# Patient Record
Sex: Male | Born: 1970
Health system: Southern US, Community
[De-identification: ages and names within clinical notes are randomized; demographics above are authoritative.]

## PROBLEM LIST (undated history)

## (undated) DIAGNOSIS — F419 Anxiety disorder, unspecified: Secondary | ICD-10-CM

## (undated) DIAGNOSIS — K589 Irritable bowel syndrome without diarrhea: Secondary | ICD-10-CM

## (undated) DIAGNOSIS — K219 Gastro-esophageal reflux disease without esophagitis: Secondary | ICD-10-CM

## (undated) DIAGNOSIS — E785 Hyperlipidemia, unspecified: Secondary | ICD-10-CM

## (undated) HISTORY — DX: Anxiety disorder, unspecified: F41.9

## (undated) HISTORY — DX: Irritable bowel syndrome, unspecified: K58.9

## (undated) HISTORY — DX: Gastro-esophageal reflux disease without esophagitis: K21.9

## (undated) HISTORY — DX: Hyperlipidemia, unspecified: E78.5

## (undated) HISTORY — PX: FORAMINOTOMY 1 LEVEL: SHX5835

---

## 2002-03-20 HISTORY — PX: WRIST SURGERY: SHX841

## 2011-12-15 DIAGNOSIS — K589 Irritable bowel syndrome without diarrhea: Secondary | ICD-10-CM | POA: Insufficient documentation

## 2011-12-15 DIAGNOSIS — E785 Hyperlipidemia, unspecified: Secondary | ICD-10-CM | POA: Insufficient documentation

## 2011-12-15 DIAGNOSIS — M542 Cervicalgia: Secondary | ICD-10-CM | POA: Insufficient documentation

## 2012-04-26 DIAGNOSIS — Z0000 Encounter for general adult medical examination without abnormal findings: Secondary | ICD-10-CM | POA: Insufficient documentation

## 2013-05-02 ENCOUNTER — Encounter: Payer: Self-pay | Admitting: Gastroenterology

## 2013-05-02 DIAGNOSIS — R1013 Epigastric pain: Secondary | ICD-10-CM | POA: Insufficient documentation

## 2013-05-02 DIAGNOSIS — K219 Gastro-esophageal reflux disease without esophagitis: Secondary | ICD-10-CM | POA: Insufficient documentation

## 2013-05-09 ENCOUNTER — Ambulatory Visit (INDEPENDENT_AMBULATORY_CARE_PROVIDER_SITE_OTHER): Payer: BC Managed Care – PPO | Admitting: Gastroenterology

## 2013-05-09 VITALS — BP 122/72 | HR 80 | Ht 69.0 in | Wt 177.2 lb

## 2013-05-09 DIAGNOSIS — R1013 Epigastric pain: Secondary | ICD-10-CM

## 2013-05-09 DIAGNOSIS — R198 Other specified symptoms and signs involving the digestive system and abdomen: Secondary | ICD-10-CM

## 2013-05-09 DIAGNOSIS — R195 Other fecal abnormalities: Secondary | ICD-10-CM

## 2013-05-09 MED ORDER — DEXLANSOPRAZOLE 60 MG PO CPDR: 60.0000 mg | DELAYED_RELEASE_CAPSULE | Freq: Every day | ORAL | Status: DC

## 2013-05-09 MED ORDER — SOD PICOSULFATE-MAG OX-CIT ACD 10-3.5-12 MG-GM-GM PO PACK: 1.0000 | PACK | ORAL | Status: DC

## 2013-05-09 MED ORDER — ESOMEPRAZOLE MAGNESIUM 40 MG PO CPDR: 40.0000 mg | DELAYED_RELEASE_CAPSULE | Freq: Every day | ORAL | Status: DC

## 2013-05-09 NOTE — Patient Instructions (Signed)
You have been scheduled for an endoscopy and colonoscopy with propofol. Please follow the written instructions given to you at your visit today. Please pick up your prep at the pharmacy within the next 1-3 days. If you use inhalers (even only as needed), please bring them with you on the day of your procedure.  We have sent the following medications to your pharmacy for you to pick up at your convenience: Nexium. Samples given to start one tablet by mouth once daily.   Thank you for choosing me and [facility].  Venita LickMalcolm T. Pleas KochStark, Jr., MD., [facility]  ZO:[id number]:William Clelia CroftShaw, MD

## 2013-05-09 NOTE — Progress Notes (Signed)
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 He describes it as a hunger pain. Takes ibuprofen frequently and recently discontinued this. He began lansoprazole 15 mg daily and ranitidine 150 mg daily about 10 days ago and his symptoms have started to improve. He relates a history of occasional reflux symptoms over the past few years treated with Zantac, Prilosec or Prevacid as needed. He also relates a change in bowel habits noted about 1 year ago noting occasionally  looser stools associated with occasional bloating and carries a presumptive diagnosis of IBS. Recent Hemosure testing was positive. His blood work and urinalysis were unremarkable except for mildly elevated cholesterol of 213. Denies weight loss, constipation,  change in stool caliber, melena, hematochezia, nausea, vomiting, dysphagia, chest pain.  No Known Allergies No outpatient prescriptions prior to visit.   No facility-administered medications prior to visit.   Past Medical History  Diagnosis Date  . Hyperlipidemia   . IBS (irritable bowel syndrome)   . GERD (gastroesophageal reflux disease)    Past Surgical History  Procedure Laterality Date  . Foraminotomy 1 level      C7  . Wrist surgery Left 2004   History   Social History  . Marital Status: Married    Spouse Name: N/A    Number of Children: N/A  . Years of Education: N/A   Social History Main Topics  . Smoking status: Former Games developermoker  . Smokeless tobacco: Never Used  . Alcohol Use: Yes  . Drug Use: No  . Sexual Activity: None   Other Topics Concern  . None   Social History Narrative  . None   Family History  Problem Relation Age of Onset  . Uterine cancer      Grandmother  . Arthritis Father   . Hyperlipidemia Father   . Alzheimer's disease Paternal Grandmother   . Alcoholism Paternal Grandfather   . Cirrhosis Paternal Grandfather     Review of  Systems: Pertinent positive and negative review of systems were noted in the above HPI section. All other review of systems were otherwise negative.   Physical Exam: General: Well developed , well nourished, no acute distress Head: Normocephalic and atraumatic Eyes:  sclerae anicteric, EOMI Ears: Normal auditory acuity Mouth: No deformity or lesions Neck: Supple, no masses or thyromegaly Lungs: Clear throughout to auscultation Heart: Regular rate and rhythm; no murmurs, rubs or bruits Abdomen: Soft, minimal epigastric tenderness without rebound or guarding and non distended. No masses, hepatosplenomegaly or hernias noted. Normal Bowel sounds Rectal: Deferred to colonoscopy Musculoskeletal: Symmetrical with no gross deformities  Skin: No lesions on visible extremities Pulses:  Normal pulses noted Extremities: No clubbing, cyanosis, edema or deformities noted Neurological: Alert oriented x 4, grossly nonfocal Cervical Nodes:  No significant cervical adenopathy Inguinal Nodes: No significant inguinal adenopathy Psychological:  Alert and cooperative. Normal mood and affect  Assessment and Recommendations:  1. Epigastric pain, change in bowel habits and Hemosure positive stool. iFOT testing is designed to be specific for colorectal blood loss. I strongly suspect he has gastritis, duodenitis or an ulcer. Rule out colorectal neoplasms and other colorectal sources of blood loss. Continue to avoid aspirin and NSAID products. Begin Nexium 40 mg daily in place of lansoprazole and ranitidine. Schedule upper endoscopy and colonoscopy. The risks, benefits, and alternatives to endoscopy with possible biopsy and possible dilation were discussed with the patient and they consent to proceed. The risks,  benefits, and alternatives to colonoscopy with possible biopsy and possible polypectomy were discussed with the patient and they consent to proceed.

## 2013-05-14 ENCOUNTER — Encounter: Payer: Self-pay | Admitting: Gastroenterology

## 2013-06-06 ENCOUNTER — Ambulatory Visit (AMBULATORY_SURGERY_CENTER): Payer: BC Managed Care – PPO | Admitting: Gastroenterology

## 2013-06-06 ENCOUNTER — Ambulatory Visit: Payer: Self-pay | Admitting: Gastroenterology

## 2013-06-06 ENCOUNTER — Encounter: Payer: Self-pay | Admitting: Gastroenterology

## 2013-06-06 VITALS — BP 125/77 | HR 74 | Temp 97.4°F | Resp 16 | Ht 69.0 in | Wt 177.0 lb

## 2013-06-06 DIAGNOSIS — K2970 Gastritis, unspecified, without bleeding: Secondary | ICD-10-CM

## 2013-06-06 DIAGNOSIS — K2940 Chronic atrophic gastritis without bleeding: Secondary | ICD-10-CM

## 2013-06-06 DIAGNOSIS — R195 Other fecal abnormalities: Secondary | ICD-10-CM

## 2013-06-06 DIAGNOSIS — R1013 Epigastric pain: Secondary | ICD-10-CM

## 2013-06-06 DIAGNOSIS — K2990 Gastroduodenitis, unspecified, without bleeding: Secondary | ICD-10-CM

## 2013-06-06 MED ORDER — SODIUM CHLORIDE 0.9 % IV SOLN: 500.0000 mL | INTRAVENOUS | Status: DC

## 2013-06-06 NOTE — Patient Instructions (Addendum)
YOU HAD AN ENDOSCOPIC PROCEDURE TODAY AT THE [facility]: Refer to the procedure report that was given to you for any specific questions about what was found during the examination.  If the procedure report does not answer your questions, please call your gastroenterologist to clarify.  If you requested that your care partner not be given the details of your procedure findings, then the procedure report has been included in a sealed envelope for you to review at your convenience later.  YOU SHOULD EXPECT: Some feelings of bloating in the abdomen. Passage of more gas than usual.  Walking can help get rid of the air that was put into your GI tract during the procedure and reduce the bloating. If you had a lower endoscopy (such as a colonoscopy or flexible sigmoidoscopy) you may notice spotting of blood in your stool or on the toilet paper. If you underwent a bowel prep for your procedure, then you may not have a normal bowel movement for a few days.  DIET: Your first meal following the procedure should be a light meal and then it is ok to progress to your normal diet.  A half-sandwich or bowl of soup is an example of a good first meal.  Heavy or fried foods are harder to digest and may make you feel nauseous or bloated.  Likewise meals heavy in dairy and vegetables can cause extra gas to form and this can also increase the bloating.  Drink plenty of fluids but you should avoid alcoholic beverages for 24 hours.  ACTIVITY: Your care partner should take you home directly after the procedure.  You should plan to take it easy, moving slowly for the rest of the day.  You can resume normal activity the day after the procedure however you should NOT DRIVE or use heavy machinery for 24 hours (because of the sedation medicines used during the test).    SYMPTOMS TO REPORT IMMEDIATELY: A gastroenterologist can be reached at any hour.  During normal business hours, 8:30 AM to 5:00 PM Monday through Friday,  call [phone].  After hours and on weekends, please call the GI answering service at [phone] who will take a message and have the physician on call contact you.   Following lower endoscopy (colonoscopy or flexible sigmoidoscopy):  Excessive amounts of blood in the stool  Significant tenderness or worsening of abdominal pains  Swelling of the abdomen that is new, acute  Fever of 100F or higher   Following upper endoscopy (EGD)  Vomiting of blood or coffee ground material  New chest pain or pain under the shoulder blades  Painful or persistently difficult swallowing  New shortness of breath  Fever of 100F or higher  Black, tarry-looking stools  FOLLOW UP: If any biopsies were taken you will be contacted by phone or by letter within the next 1-3 weeks.  Call your gastroenterologist if you have not heard about the biopsies in 3 weeks.  Our staff will call the home number listed on your records the next business day following your procedure to check on you and address any questions or concerns that you may have at that time regarding the information given to you following your procedure. This is a courtesy call and so if there is no answer at the home number and we have not heard from you through the emergency physician on call, we will assume that you have returned to your regular daily activities without incident.  SIGNATURES/CONFIDENTIALITY: You and/or your  care partner have signed paperwork which will be entered into your electronic medical record.  These signatures attest to the fact that that the information above on your After Visit Summary has been reviewed and is understood.  Full responsibility of the confidentiality of this discharge information lies with you and/or your care-partner.  Hemorrhoids, gastritis, anti-reflux regimen-handouts given  Repeat colonoscopy in 10 years.  Avoid NSAIDs.  Continue nexium.

## 2013-06-06 NOTE — Op Note (Signed)
[facility] [address] [place], [id number]   COLONOSCOPY PROCEDURE REPORT  PATIENT: Jose Maddox, Abbott  MR#: [id number] BIRTHDATE: 1970/05/22 , 42  yrs. old GENDER: Male ENDOSCOPIST: Meryl DareMalcolm T Abbe Bula, MD, [facility] REFERRED [id number]:Douglass Cyndie MullW Shaw, M.D. PROCEDURE DATE:  06/06/2013 PROCEDURE:   Colonoscopy, diagnostic First Screening Colonoscopy - Avg.  risk and is 50 yrs.  old or older - No.  Prior Negative Screening - Now for repeat screening. N/A  History of Adenoma - Now for follow-up colonoscopy & has been > or = to 3 yrs.  N/A  Polyps Removed Today? No.  Recommend repeat exam, <10 yrs? No. ASA CLASS:   Class II INDICATIONS:heme-positive stool and change in bowel habits. MEDICATIONS: MAC sedation, administered by CRNA and propofol (Diprivan) 360mg  IV DESCRIPTION OF PROCEDURE:   After the risks benefits and alternatives of the procedure were thoroughly explained, informed consent was obtained.  A digital rectal exam revealed no abnormalities of the rectum.   The LB [id number]  endoscope was introduced through the anus and advanced to the cecum, which was identified by both the appendix and ileocecal valve. No adverse events experienced.   The quality of the prep was Prepopik excellent  The instrument was then slowly withdrawn as the colon was fully examined.  COLON FINDINGS: A normal appearing cecum, ileocecal valve, and appendiceal orifice were identified.  The ascending, hepatic flexure, transverse, splenic flexure, descending, sigmoid colon and rectum appeared unremarkable.  No polyps or cancers were seen. Retroflexed views revealed small internal hemorrhoids. The time to cecum=1 minutes 36 seconds.  Withdrawal time=11 minutes 45 seconds. The scope was withdrawn and the procedure completed.  COMPLICATIONS: There were no complications.  ENDOSCOPIC IMPRESSION: 1.  Normal colon 2.  Small internal hemorrhoids  RECOMMENDATIONS: 1.  You should continue to  follow colorectal cancer screening guidelines for "routine risk" patients with a repeat colonoscopy in 10 years.  There is no need for routine, screening FOBT (stool) testing for at least 5 years.  eSigned:  Meryl DareMalcolm T Lakya Schrupp, MD, [facility] 06/06/2013 4:03 PM

## 2013-06-06 NOTE — Progress Notes (Signed)
Procedure ends, to recovery, report given and VSS. 

## 2013-06-06 NOTE — Progress Notes (Signed)
Called to room to assist during endoscopic procedure.  Patient ID and intended procedure confirmed with present staff. Received instructions for my participation in the procedure from the performing physician.  

## 2013-06-06 NOTE — Op Note (Signed)
[facility] [address] [place], [id number]   ENDOSCOPY PROCEDURE REPORT  PATIENT: Jose Maddox, Jose Maddox  MR#: [id number] BIRTHDATE: [birth year] , 42  yrs. old GENDER: Male ENDOSCOPIST: Meryl DareMalcolm T Suhaylah Wampole, MD, [facility] REFERRED BY:  Kari BaarsW.  Douglas Shaw, M.D. PROCEDURE DATE:  06/06/2013 PROCEDURE:  EGD w/ biopsy ASA CLASS:     Class II INDICATIONS:  Epigastric pain.   Heme positive stool. MEDICATIONS: There was residual sedation effect present from prior procedure, MAC sedation, administered by CRNA, and propofol (Diprivan) 280mg  IV TOPICAL ANESTHETIC: none DESCRIPTION OF PROCEDURE: After the risks benefits and alternatives of the procedure were thoroughly explained, informed consent was obtained.  The LB [id number] endoscope was introduced through the mouth and advanced to the second portion of the duodenum. Without limitations.  The instrument was slowly withdrawn as the mucosa was fully examined.  STOMACH: Mild gastritis  was found in the gastric body.  Multiple biopsies were performed.   The stomach otherwise appeared normal.  ESOPHAGUS: The mucosa of the esophagus appeared normal. DUODENUM: The duodenal mucosa showed no abnormalities in the bulb and second portion of the duodenum.  Retroflexed views revealed no abnormalities.   The scope was then withdrawn from the patient and the procedure completed.  COMPLICATIONS: There were no complications.  ENDOSCOPIC IMPRESSION: 1.   Gastritis in the gastric body; multiple biopsies 2.   The EGD otherwise appeared normal  RECOMMENDATIONS: 1.  Anti-reflux regimen 2.  Avoid NSAIDS 3.  Continue PPI 4.  Await pathology results  eSigned:  Meryl DareMalcolm T Jari Carollo, MD, [facility] 06/06/2013 4:11 PM

## 2013-06-09 ENCOUNTER — Telehealth: Payer: Self-pay | Admitting: *Deleted

## 2013-06-09 NOTE — Telephone Encounter (Signed)
  Follow up Call-  Call back number 06/06/2013  Post procedure Call Back phone  # [phone]  Permission to leave phone message Yes     Patient questions:  Do you have a fever, pain , or abdominal swelling? no Pain Score  0 *  Have you tolerated food without any problems? yes  Have you been able to return to your normal activities? yes  Do you have any questions about your discharge instructions: Diet   no Medications  no Follow up visit  no  Do you have questions or concerns about your Care? no  Actions: * If pain score is 4 or above: No action needed, pain <4.

## 2013-06-11 ENCOUNTER — Encounter: Payer: Self-pay | Admitting: Gastroenterology

## 2015-03-03 ENCOUNTER — Other Ambulatory Visit: Payer: Self-pay | Admitting: Internal Medicine

## 2015-03-03 DIAGNOSIS — R1310 Dysphagia, unspecified: Secondary | ICD-10-CM

## 2015-03-16 ENCOUNTER — Ambulatory Visit
Admission: RE | Admit: 2015-03-16 | Discharge: 2015-03-16 | Disposition: A | Discharge status: Home / Self Care | Payer: BLUE CROSS/BLUE SHIELD | Source: Ambulatory Visit | Attending: Internal Medicine | Admitting: Internal Medicine

## 2015-03-16 DIAGNOSIS — R1310 Dysphagia, unspecified: Secondary | ICD-10-CM

## 2015-05-05 ENCOUNTER — Encounter: Payer: Self-pay | Admitting: Internal Medicine

## 2015-11-16 DIAGNOSIS — N529 Male erectile dysfunction, unspecified: Secondary | ICD-10-CM | POA: Diagnosis not present

## 2015-12-17 DIAGNOSIS — Z125 Encounter for screening for malignant neoplasm of prostate: Secondary | ICD-10-CM | POA: Diagnosis not present

## 2015-12-17 DIAGNOSIS — Z0000 Encounter for general adult medical examination without abnormal findings: Secondary | ICD-10-CM | POA: Diagnosis not present

## 2015-12-17 DIAGNOSIS — E784 Other hyperlipidemia: Secondary | ICD-10-CM | POA: Diagnosis not present

## 2015-12-23 DIAGNOSIS — Z23 Encounter for immunization: Secondary | ICD-10-CM | POA: Diagnosis not present

## 2015-12-24 DIAGNOSIS — K219 Gastro-esophageal reflux disease without esophagitis: Secondary | ICD-10-CM | POA: Diagnosis not present

## 2015-12-24 DIAGNOSIS — Z1389 Encounter for screening for other disorder: Secondary | ICD-10-CM | POA: Diagnosis not present

## 2015-12-24 DIAGNOSIS — E784 Other hyperlipidemia: Secondary | ICD-10-CM | POA: Diagnosis not present

## 2015-12-24 DIAGNOSIS — R1319 Other dysphagia: Secondary | ICD-10-CM | POA: Diagnosis not present

## 2015-12-24 DIAGNOSIS — Z1212 Encounter for screening for malignant neoplasm of rectum: Secondary | ICD-10-CM | POA: Diagnosis not present

## 2015-12-24 DIAGNOSIS — Z0000 Encounter for general adult medical examination without abnormal findings: Secondary | ICD-10-CM | POA: Diagnosis not present

## 2015-12-24 DIAGNOSIS — F458 Other somatoform disorders: Secondary | ICD-10-CM | POA: Diagnosis not present

## 2015-12-31 DIAGNOSIS — M791 Myalgia: Secondary | ICD-10-CM | POA: Diagnosis not present

## 2015-12-31 DIAGNOSIS — M7551 Bursitis of right shoulder: Secondary | ICD-10-CM | POA: Diagnosis not present

## 2015-12-31 DIAGNOSIS — M542 Cervicalgia: Secondary | ICD-10-CM | POA: Diagnosis not present

## 2015-12-31 DIAGNOSIS — M5030 Other cervical disc degeneration, unspecified cervical region: Secondary | ICD-10-CM | POA: Diagnosis not present

## 2016-03-25 DIAGNOSIS — F17200 Nicotine dependence, unspecified, uncomplicated: Secondary | ICD-10-CM | POA: Diagnosis not present

## 2016-03-25 DIAGNOSIS — L218 Other seborrheic dermatitis: Secondary | ICD-10-CM | POA: Diagnosis not present

## 2016-04-14 DIAGNOSIS — F17200 Nicotine dependence, unspecified, uncomplicated: Secondary | ICD-10-CM | POA: Diagnosis not present

## 2016-07-07 DIAGNOSIS — R51 Headache: Secondary | ICD-10-CM | POA: Diagnosis not present

## 2016-07-07 DIAGNOSIS — Z6826 Body mass index (BMI) 26.0-26.9, adult: Secondary | ICD-10-CM | POA: Diagnosis not present

## 2016-07-07 DIAGNOSIS — M2550 Pain in unspecified joint: Secondary | ICD-10-CM | POA: Diagnosis not present

## 2016-07-07 DIAGNOSIS — Z1389 Encounter for screening for other disorder: Secondary | ICD-10-CM | POA: Diagnosis not present

## 2016-10-27 DIAGNOSIS — M791 Myalgia: Secondary | ICD-10-CM | POA: Diagnosis not present

## 2016-10-27 DIAGNOSIS — M5030 Other cervical disc degeneration, unspecified cervical region: Secondary | ICD-10-CM | POA: Diagnosis not present

## 2016-10-27 DIAGNOSIS — M542 Cervicalgia: Secondary | ICD-10-CM | POA: Diagnosis not present

## 2016-10-27 DIAGNOSIS — M7551 Bursitis of right shoulder: Secondary | ICD-10-CM | POA: Diagnosis not present

## 2017-04-20 DIAGNOSIS — R1310 Dysphagia, unspecified: Secondary | ICD-10-CM | POA: Diagnosis not present

## 2017-04-20 DIAGNOSIS — F458 Other somatoform disorders: Secondary | ICD-10-CM | POA: Diagnosis not present

## 2017-04-20 DIAGNOSIS — Z6825 Body mass index (BMI) 25.0-25.9, adult: Secondary | ICD-10-CM | POA: Diagnosis not present

## 2017-04-20 DIAGNOSIS — R070 Pain in throat: Secondary | ICD-10-CM | POA: Diagnosis not present

## 2017-05-01 DIAGNOSIS — K219 Gastro-esophageal reflux disease without esophagitis: Secondary | ICD-10-CM | POA: Insufficient documentation

## 2017-05-01 DIAGNOSIS — R070 Pain in throat: Secondary | ICD-10-CM | POA: Insufficient documentation

## 2017-05-01 DIAGNOSIS — R1313 Dysphagia, pharyngeal phase: Secondary | ICD-10-CM | POA: Insufficient documentation

## 2017-05-25 ENCOUNTER — Ambulatory Visit: Payer: BLUE CROSS/BLUE SHIELD | Admitting: Gastroenterology

## 2017-06-15 ENCOUNTER — Ambulatory Visit: Payer: Self-pay | Admitting: Podiatry

## 2017-12-21 DIAGNOSIS — E7849 Other hyperlipidemia: Secondary | ICD-10-CM | POA: Diagnosis not present

## 2017-12-21 DIAGNOSIS — Z0000 Encounter for general adult medical examination without abnormal findings: Secondary | ICD-10-CM | POA: Diagnosis not present

## 2017-12-28 DIAGNOSIS — Z1389 Encounter for screening for other disorder: Secondary | ICD-10-CM | POA: Diagnosis not present

## 2017-12-28 DIAGNOSIS — M2550 Pain in unspecified joint: Secondary | ICD-10-CM | POA: Diagnosis not present

## 2017-12-28 DIAGNOSIS — E7849 Other hyperlipidemia: Secondary | ICD-10-CM | POA: Diagnosis not present

## 2017-12-28 DIAGNOSIS — K219 Gastro-esophageal reflux disease without esophagitis: Secondary | ICD-10-CM | POA: Diagnosis not present

## 2017-12-28 DIAGNOSIS — F458 Other somatoform disorders: Secondary | ICD-10-CM | POA: Diagnosis not present

## 2017-12-28 DIAGNOSIS — Z0000 Encounter for general adult medical examination without abnormal findings: Secondary | ICD-10-CM | POA: Diagnosis not present

## 2018-01-03 DIAGNOSIS — Z1212 Encounter for screening for malignant neoplasm of rectum: Secondary | ICD-10-CM | POA: Diagnosis not present

## 2018-12-02 DIAGNOSIS — Z23 Encounter for immunization: Secondary | ICD-10-CM | POA: Diagnosis not present

## 2018-12-08 DIAGNOSIS — Z1159 Encounter for screening for other viral diseases: Secondary | ICD-10-CM | POA: Diagnosis not present

## 2018-12-27 DIAGNOSIS — E7849 Other hyperlipidemia: Secondary | ICD-10-CM | POA: Diagnosis not present

## 2018-12-27 DIAGNOSIS — Z0000 Encounter for general adult medical examination without abnormal findings: Secondary | ICD-10-CM | POA: Diagnosis not present

## 2018-12-27 DIAGNOSIS — Z125 Encounter for screening for malignant neoplasm of prostate: Secondary | ICD-10-CM | POA: Diagnosis not present

## 2019-01-03 DIAGNOSIS — M2550 Pain in unspecified joint: Secondary | ICD-10-CM | POA: Diagnosis not present

## 2019-01-03 DIAGNOSIS — Z0000 Encounter for general adult medical examination without abnormal findings: Secondary | ICD-10-CM | POA: Diagnosis not present

## 2019-01-03 DIAGNOSIS — Z1331 Encounter for screening for depression: Secondary | ICD-10-CM | POA: Diagnosis not present

## 2019-01-03 DIAGNOSIS — E785 Hyperlipidemia, unspecified: Secondary | ICD-10-CM | POA: Diagnosis not present

## 2019-01-03 DIAGNOSIS — K219 Gastro-esophageal reflux disease without esophagitis: Secondary | ICD-10-CM | POA: Diagnosis not present

## 2019-01-03 DIAGNOSIS — F458 Other somatoform disorders: Secondary | ICD-10-CM | POA: Diagnosis not present

## 2019-01-08 ENCOUNTER — Other Ambulatory Visit: Payer: Self-pay

## 2019-01-08 DIAGNOSIS — Z20822 Contact with and (suspected) exposure to covid-19: Secondary | ICD-10-CM

## 2019-01-08 DIAGNOSIS — Z6822 Body mass index (BMI) 22.0-22.9, adult: Secondary | ICD-10-CM | POA: Diagnosis not present

## 2019-01-08 DIAGNOSIS — R509 Fever, unspecified: Secondary | ICD-10-CM | POA: Diagnosis not present

## 2019-01-08 DIAGNOSIS — Z03818 Encounter for observation for suspected exposure to other biological agents ruled out: Secondary | ICD-10-CM | POA: Diagnosis not present

## 2019-01-09 LAB — NOVEL CORONAVIRUS, NAA: SARS-CoV-2, NAA: NOT DETECTED

## 2019-01-24 DIAGNOSIS — D225 Melanocytic nevi of trunk: Secondary | ICD-10-CM | POA: Diagnosis not present

## 2019-01-24 DIAGNOSIS — D2261 Melanocytic nevi of right upper limb, including shoulder: Secondary | ICD-10-CM | POA: Diagnosis not present

## 2019-01-24 DIAGNOSIS — D485 Neoplasm of uncertain behavior of skin: Secondary | ICD-10-CM | POA: Diagnosis not present

## 2019-01-24 DIAGNOSIS — D2271 Melanocytic nevi of right lower limb, including hip: Secondary | ICD-10-CM | POA: Diagnosis not present

## 2019-01-24 DIAGNOSIS — D2262 Melanocytic nevi of left upper limb, including shoulder: Secondary | ICD-10-CM | POA: Diagnosis not present

## 2019-01-24 DIAGNOSIS — L820 Inflamed seborrheic keratosis: Secondary | ICD-10-CM | POA: Diagnosis not present

## 2019-02-12 ENCOUNTER — Other Ambulatory Visit: Payer: Self-pay

## 2019-02-12 DIAGNOSIS — Z20822 Contact with and (suspected) exposure to covid-19: Secondary | ICD-10-CM

## 2019-02-14 LAB — NOVEL CORONAVIRUS, NAA: SARS-CoV-2, NAA: NOT DETECTED

## 2019-04-18 DIAGNOSIS — G4452 New daily persistent headache (NDPH): Secondary | ICD-10-CM | POA: Diagnosis not present

## 2019-06-27 DIAGNOSIS — L739 Follicular disorder, unspecified: Secondary | ICD-10-CM | POA: Diagnosis not present

## 2019-06-27 DIAGNOSIS — H60501 Unspecified acute noninfective otitis externa, right ear: Secondary | ICD-10-CM | POA: Diagnosis not present

## 2019-08-15 DIAGNOSIS — R0609 Other forms of dyspnea: Secondary | ICD-10-CM | POA: Diagnosis not present

## 2019-08-15 DIAGNOSIS — R5381 Other malaise: Secondary | ICD-10-CM | POA: Diagnosis not present

## 2019-08-15 DIAGNOSIS — R0781 Pleurodynia: Secondary | ICD-10-CM | POA: Diagnosis not present

## 2019-08-15 DIAGNOSIS — E785 Hyperlipidemia, unspecified: Secondary | ICD-10-CM | POA: Diagnosis not present

## 2019-08-19 ENCOUNTER — Other Ambulatory Visit: Payer: Self-pay | Admitting: Internal Medicine

## 2019-08-19 DIAGNOSIS — E785 Hyperlipidemia, unspecified: Secondary | ICD-10-CM

## 2019-09-26 ENCOUNTER — Other Ambulatory Visit: Payer: BLUE CROSS/BLUE SHIELD

## 2019-10-10 ENCOUNTER — Ambulatory Visit
Admission: RE | Admit: 2019-10-10 | Discharge: 2019-10-10 | Disposition: A | Discharge status: Home / Self Care | Payer: Self-pay | Source: Ambulatory Visit | Attending: Internal Medicine | Admitting: Internal Medicine

## 2019-10-10 DIAGNOSIS — R0602 Shortness of breath: Secondary | ICD-10-CM | POA: Diagnosis not present

## 2019-10-10 DIAGNOSIS — E785 Hyperlipidemia, unspecified: Secondary | ICD-10-CM

## 2019-12-26 DIAGNOSIS — Z0000 Encounter for general adult medical examination without abnormal findings: Secondary | ICD-10-CM | POA: Diagnosis not present

## 2019-12-26 DIAGNOSIS — E785 Hyperlipidemia, unspecified: Secondary | ICD-10-CM | POA: Diagnosis not present

## 2019-12-26 DIAGNOSIS — Z125 Encounter for screening for malignant neoplasm of prostate: Secondary | ICD-10-CM | POA: Diagnosis not present

## 2020-01-02 DIAGNOSIS — R82998 Other abnormal findings in urine: Secondary | ICD-10-CM | POA: Diagnosis not present

## 2020-01-02 DIAGNOSIS — Z1331 Encounter for screening for depression: Secondary | ICD-10-CM | POA: Diagnosis not present

## 2020-01-02 DIAGNOSIS — Z1339 Encounter for screening examination for other mental health and behavioral disorders: Secondary | ICD-10-CM | POA: Diagnosis not present

## 2020-01-02 DIAGNOSIS — E785 Hyperlipidemia, unspecified: Secondary | ICD-10-CM | POA: Diagnosis not present

## 2020-01-02 DIAGNOSIS — Z0000 Encounter for general adult medical examination without abnormal findings: Secondary | ICD-10-CM | POA: Diagnosis not present

## 2020-03-04 DIAGNOSIS — Z1212 Encounter for screening for malignant neoplasm of rectum: Secondary | ICD-10-CM | POA: Diagnosis not present

## 2020-06-10 ENCOUNTER — Other Ambulatory Visit: Payer: Self-pay

## 2020-06-10 ENCOUNTER — Encounter: Payer: Self-pay | Admitting: Psychiatry

## 2020-06-10 ENCOUNTER — Telehealth (INDEPENDENT_AMBULATORY_CARE_PROVIDER_SITE_OTHER): Payer: BC Managed Care – PPO | Admitting: Psychiatry

## 2020-06-10 DIAGNOSIS — F411 Generalized anxiety disorder: Secondary | ICD-10-CM | POA: Diagnosis not present

## 2020-06-10 DIAGNOSIS — F32A Depression, unspecified: Secondary | ICD-10-CM | POA: Insufficient documentation

## 2020-06-10 MED ORDER — SERTRALINE HCL 25 MG PO TABS: 25.0000 mg | ORAL_TABLET | Freq: Every day | ORAL | 1 refills | Status: DC

## 2020-06-10 NOTE — Progress Notes (Signed)
Virtual Visit via Video Note  I connected with Jose Maddox on 06/10/20 at  1:00 PM EDT by a video enabled telemedicine application and verified that I am speaking with the correct person using two identifiers.  Location Provider Location : [facility] Patient Location : Car  Participants: Patient , Provider   I discussed the limitations of evaluation and management by telemedicine and the availability of in person appointments. The patient expressed understanding and agreed to proceed.   I discussed the assessment and treatment plan with the patient. The patient was provided an opportunity to ask questions and all were answered. The patient agreed with the plan and demonstrated an understanding of the instructions.   The patient was advised to call back or seek an in-person evaluation if the symptoms worsen or if the condition fails to improve as anticipated.   Psychiatric Initial Adult Assessment   Patient Identification: Jose Maddox MRN:  [id number] Date of Evaluation:  06/10/2020 Referral Source: Dr. Martha Clan Chief Complaint:   Chief Complaint    Establish Care; Depression; Anxiety     Visit Diagnosis: R/O Alcohol use disorder   ICD-10-CM   1. GAD (generalized anxiety disorder)  F41.1 sertraline (ZOLOFT) 25 MG tablet  2. Depression, unspecified depression type  F32.A     History of Present Illness:  Jose Maddox is a 50 year old Caucasian male, employed, married, lives in [place], has a history of gastroesophageal reflux disease, hyperlipidemia unspecified, anxiety disorder unspecified, male erectile disorder, nonscarring hair loss, cervicalgia, was evaluated by telemedicine today.  Patient reports he has been struggling with anxiety symptoms since the past year or so getting worse since the past several months.  Patient reports he worries often about different things.  He reports he has difficulty relaxing, is restless, nervous and so on.  Patient reports he has had  episodes of possible anxiety attacks in the past.  This may have happened while he was driving.  He reports when he has these episodes her feels dizzy and has an internal sensation as though he is going to fall.  He reports when he feels that way he needs to pull over and that helps him to relax.  There has been times when this has lasted for 30 minutes or so.  When he discussed this with his primary care provider he was advised to get an ENT evaluation.  He has not done that yet.     Patient also reports an episode when he was at work.  He works as an Associate Professor.  He reports there was this day when he felt he went into a panic mood.  At that time he became febrile and his temperature was also 101F.  He felt flushed and felt nervous.  He however reports that resolved after an hour or so.  He clearly does not know what could have happened that day.  Patient does report feeling sad often.  He reports he may have felt this way all his life however it could be getting worse.  He does not feel happy anymore.  He does report he does not feel like he enjoys anything in his life anymore.  He does struggle with fatigue during the day.  He also reports he easily gets irritable and later on when he thinks about it he understands he should not have acted the way he did.  He reports he works full-time, sees up to 60 patients per day.  He wakes up at around 5:30 in the morning and goes  to bed at around 10:30 PM.  He has an hour commute back and forth from home every day.  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  He does not have any help there which could all be triggering his fatigue.  He reports even though he sleeps okay he wakes up feeling tired or fatigued.  He likes to go back to sleep or taking naps however he never gets time to do that and is able to function without taking naps.  Patient does report he does struggle with his appetite on and off.  He does struggle with feeling of worthlessness on and  off.  Patient denies any suicidality, homicidality or perceptual disturbances.  Patient denies any history of trauma.  He however reports his parents divorced , happened when he was in eighth grade , it was 'pretty ugly'.  He has an okay relationship with his dad who lives in [place] now.  Patient however reports his mother and her husband moved to [place] and he has not spoken to her in the past few months.  Patient reports she did not want to get vaccinated and that could have triggered it.     Patient does report at least 2 head injuries in the past growing up.  He currently denies any cognitive issues.  Patient does report history of alcoholism.  He reports he may have tried to self medicate himself.  He has cut back a lot and currently drinks up to 6 drinks on weekends only.  Patient does report blackouts rarely.  Denies any withdrawal symptoms.  Patient denies abusing any other drugs other than marijuana , uses it couple of times a year.  Patient does report arthralgia, unknown if this has a psychosomatic component.  Patient does have a history of C7 foraminotomy as well as left wrist surgery.  Arthritis also runs in his family.  He will continue to follow-up with his primary care provider.     Associated Signs/Symptoms: Depression Symptoms:  depressed mood, anhedonia, fatigue, feelings of worthlessness/guilt, difficulty concentrating, anxiety, loss of energy/fatigue, (Hypo) Manic Symptoms:  Denies Anxiety Symptoms:  Excessive Worry, Panic Symptoms, Psychotic Symptoms:  Denies PTSD Symptoms: Negative  Past Psychiatric History: Patient denies previous treatment for mental health problems.  Patient denies inpatient mental health admissions.  Patient denies suicide attempts.  Previous Psychotropic Medications: No   Substance Abuse History in the last 12 months:  Yes.  Alcoholism in the past.  Currently has cut back to just 6 drinks of beer on weekends.  Consequences of  Substance Abuse: Blackouts:  Rarely  Past Medical History:  Past Medical History:  Diagnosis Date  . Anxiety   . GERD (gastroesophageal reflux disease)   . Hyperlipidemia   . IBS (irritable bowel syndrome)     Past Surgical History:  Procedure Laterality Date  . FORAMINOTOMY 1 LEVEL     C7  . WRIST SURGERY Left 2004    Family Psychiatric History: Alcoholism runs in his family.  Father-alcohol abuse, paternal grandfather died due to alcoholism.  Paternal uncle-heroin abuse.  Paternal uncle #2-drug abuse.  Family History:  Family History  Problem Relation Age of Onset  . Uterine cancer Other        Grandmother  . Arthritis Father   . Hyperlipidemia Father   . Alcohol abuse Father   . Alzheimer's disease Paternal Grandmother   . Alcoholism Paternal Grandfather   . Cirrhosis Paternal Grandfather   . Alcohol abuse Paternal Grandfather   .  Drug abuse Paternal Uncle   . Drug abuse Paternal Uncle     Social History:   Social History   Socioeconomic History  . Marital status: Married    Spouse name: Not on file  . Number of children: 2  . Years of education: Not on file  . Highest education level: Not on file  Occupational History  . Not on file  Tobacco Use  . Smoking status: Former Smoker    Quit date: 06/10/2017    Years since quitting: 3.0  . Smokeless tobacco: Former NeurosurgeonUser    Types: Snuff    Quit date: 05/13/2017  Substance and Sexual Activity  . Alcohol use: Yes    Alcohol/week: 6.0 standard drinks    Types: 6 Cans of beer per week  . Drug use: Not on file    Comment: twice a year  . Sexual activity: Yes  Other Topics Concern  . Not on file  Social History Narrative  . Not on file   Social Determinants of Health   Financial Resource Strain: Not on file  Food Insecurity: Not on file  Transportation Needs: Not on file  Physical Activity: Not on file  Stress: Not on file  Social Connections: Not on file    Additional Social History: Patient was born  in [place].  He grew up in [place].  His parents divorced when he was in the eighth grade.  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  Patient moved to [place] in 2009.  He currently has his own practice.  He is married.  He has a son and a daughter, 519-year-old and 51 year old.  He lives in [place] with his wife and kids.  Patient denies any history of trauma.  Allergies:  No Known Allergies  Metabolic Disorder Labs: No results found for: HGBA1C, MPG No results found for: PROLACTIN No results found for: CHOL, TRIG, HDL, CHOLHDL, VLDL, LDLCALC No results found for: TSH  Therapeutic Level Labs: No results found for: LITHIUM No results found for: CBMZ No results found for: VALPROATE  Current Medications: Current Outpatient Medications  Medication Sig Dispense Refill  . finasteride (PROPECIA) 1 MG tablet Take 1 mg by mouth daily.    Marland Kitchen. ibuprofen (ADVIL,MOTRIN) 200 MG tablet Take 200 mg by mouth every 6 (six) hours as needed.    . Misc. Devices MISC Compounding pharmacy cream - gaba 5%, keta 5%, keto 5%, lido 5%, amit 2%, carb cream  Apply daily prn pain. Dispense 4 ounces. 5 refills.    . pantoprazole (PROTONIX) 40 MG tablet Take 40 mg by mouth daily.    . sertraline (ZOLOFT) 25 MG tablet Take 1 tablet (25 mg total) by mouth daily with breakfast. 30 tablet 1   No current facility-administered medications for this visit.    Musculoskeletal: Strength & Muscle Tone: UTA Gait & Station: UTA Patient leans: N/A  Psychiatric Specialty Exam: Review of Systems  Constitutional: Positive for fatigue.  Musculoskeletal: Positive for arthralgias.  Psychiatric/Behavioral: Positive for dysphoric mood. The patient is nervous/anxious.   All other systems reviewed and are negative.   There were no vitals taken for this visit.There is no height or weight on file to calculate BMI.  General Appearance: Casual  Eye Contact:  Fair  Speech:  Clear and Coherent   Volume:  Normal  Mood:  Anxious and Depressed  Affect:  Congruent  Thought Process:  Goal Directed and Descriptions of Associations: Intact  Orientation:  Full (Time, Place, and Person)  Thought Content:  Logical  Suicidal Thoughts:  No  Homicidal Thoughts:  No  Memory:  Immediate;   Fair Recent;   Fair Remote;   Fair  Judgement:  Fair  Insight:  Fair  Psychomotor Activity:  Normal  Concentration:  Concentration: Fair and Attention Span: Fair  Recall:  [employer] of Knowledge:Fair  Language: Fair  Akathisia:  No  Handed:  Right  AIMS (if indicated):  UTA  Assets:  Communication Skills Desire for Improvement Financial Resources/Insurance Housing Intimacy Leisure Time Resilience Social Support Talents/Skills Transportation Vocational/Educational  ADL's:  Intact  Cognition: WNL  Sleep:  Fair   Screenings: GAD-7   Flowsheet Row Video Visit from 06/10/2020 in [facility]  Total GAD-7 Score 14    PHQ2-9   Flowsheet Row Video Visit from 06/10/2020 in [facility]  PHQ-2 Total Score 3  PHQ-9 Total Score 12    Flowsheet Row Video Visit from 06/10/2020 in [facility]  C-SSRS RISK CATEGORY No Risk      Assessment and Plan: Jose Maddox is a 50 year old Caucasian male, employed, lives in [place], married, has a history of gastroesophageal reflux disease, hyperlipidemia, anxiety disorder unspecified, hair loss, erectile disorder, was evaluated by telemedicine today.  Patient does have history of alcoholism although currently making progress and he is currently cutting back.  Patient also reports alcoholism runs in his family.  Patient does have anxiety symptoms as well as depressive symptoms, possible anxiety attacks however he needs ENT evaluation to rule out other causes.  This was discussed with patient.  Plan as noted below. The patient demonstrates the following risk factors for  suicide: Chronic risk factors for suicide include: psychiatric disorder of anxiety, depression and substance use disorder. Acute risk factors for suicide include: job stressors. Protective factors for this patient include: positive social support, positive therapeutic relationship, responsibility to others (children, family), coping skills, hope for the future and life satisfaction. Considering these factors, the overall suicide risk at this point appears to be low. Patient is appropriate for outpatient follow up.  Plan GAD-unstable GAD 7 equals 14 Start sertraline 25 mg p.o. daily with breakfast Referral for CBT.  Provided information for psychology today.  Depressive disorder unspecified-unstable PHQ 9 today equals 12 Sertraline will help.  Patient will also benefit from CBT  Rule out alcohol use disorder-will monitor closely.   Discussed with patient he will benefit from medical clearance rule out other causes of this symptoms which happens when he drives.  I have reviewed labs-TSH dated 12/26/2019-0.96-within normal limits.  Follow-up in clinic in 4 weeks or sooner if needed.  This note was generated in part or whole with voice recognition software. Voice recognition is usually quite accurate but there are transcription errors that can and very often do occur. I apologize for any typographical errors that were not detected and corrected.        Jomarie Longs, MD 3/25/202211:53 AM

## 2020-06-10 NOTE — Patient Instructions (Signed)
Sertraline Tablets What is this medicine? SERTRALINE (SER tra leen) is used to treat depression. It may also be used to treat obsessive compulsive disorder, panic disorder, post-trauma stress disorder, premenstrual dysphoric disorder (PMDD) or social anxiety. This medicine may be used for other purposes; ask your health care provider or pharmacist if you have questions. COMMON BRAND NAME(S): Zoloft What should I tell my health care provider before I take this medicine? They need to know if you have any of these conditions:  bleeding disorders  bipolar disorder or a family history of bipolar disorder  glaucoma  heart disease  high blood pressure  history of irregular heartbeat  history of low levels of calcium, magnesium, or potassium in the blood  if you often drink alcohol  liver disease  receiving electroconvulsive therapy  seizures  suicidal thoughts, plans, or attempt; a previous suicide attempt by you or a family member  take medicines that treat or prevent blood clots  thyroid disease  an unusual or allergic reaction to sertraline, other medicines, foods, dyes, or preservatives  pregnant or trying to get pregnant  breast-feeding How should I use this medicine? Take this medicine by mouth with a glass of water. Follow the directions on the prescription label. You can take it with or without food. Take your medicine at regular intervals. Do not take your medicine more often than directed. Do not stop taking this medicine suddenly except upon the advice of your doctor. Stopping this medicine too quickly may cause serious side effects or your condition may worsen. A special MedGuide will be given to you by the pharmacist with each prescription and refill. Be sure to read this information carefully each time. Talk to your pediatrician regarding the use of this medicine in children. While this drug may be prescribed for children as young as 7 years for selected conditions,  precautions do apply. Overdosage: If you think you have taken too much of this medicine contact a poison control center or emergency room at once. NOTE: This medicine is only for you. Do not share this medicine with others. What if I miss a dose? If you miss a dose, take it as soon as you can. If it is almost time for your next dose, take only that dose. Do not take double or extra doses. What may interact with this medicine? Do not take this medicine with any of the following medications:  cisapride  dronedarone  linezolid  MAOIs like Carbex, Eldepryl, Marplan, Nardil, and Parnate  methylene blue (injected into a vein)  pimozide  thioridazine This medicine may also interact with the following medications:  alcohol  amphetamines  aspirin and aspirin-like medicines  certain medicines for depression, anxiety, or psychotic disturbances  certain medicines for fungal infections like ketoconazole, fluconazole, posaconazole, and itraconazole  certain medicines for irregular heart beat like flecainide, quinidine, propafenone  certain medicines for migraine headaches like almotriptan, eletriptan, frovatriptan, naratriptan, rizatriptan, sumatriptan, zolmitriptan  certain medicines for sleep  certain medicines for seizures like carbamazepine, valproic acid, phenytoin  certain medicines that treat or prevent blood clots like warfarin, enoxaparin, dalteparin  cimetidine  digoxin  diuretics  fentanyl  isoniazid  lithium  NSAIDs, medicines for pain and inflammation, like ibuprofen or naproxen  other medicines that prolong the QT interval (cause an abnormal heart rhythm) like dofetilide  rasagiline  safinamide  supplements like St. John's wort, kava kava, valerian  tolbutamide  tramadol  tryptophan This list may not describe all possible interactions. Give your health care   provider a list of all the medicines, herbs, non-prescription drugs, or dietary supplements  you use. Also tell them if you smoke, drink alcohol, or use illegal drugs. Some items may interact with your medicine. What should I watch for while using this medicine? Tell your doctor if your symptoms do not get better or if they get worse. Visit your doctor or health care professional for regular checks on your progress. Because it may take several weeks to see the full effects of this medicine, it is important to continue your treatment as prescribed by your doctor. Patients and their families should watch out for new or worsening thoughts of suicide or depression. Also watch out for sudden changes in feelings such as feeling anxious, agitated, panicky, irritable, hostile, aggressive, impulsive, severely restless, overly excited and hyperactive, or not being able to sleep. If this happens, especially at the beginning of treatment or after a change in dose, call your health care professional. You may get drowsy or dizzy. Do not drive, use machinery, or do anything that needs mental alertness until you know how this medicine affects you. Do not stand or sit up quickly, especially if you are an older patient. This reduces the risk of dizzy or fainting spells. Alcohol may interfere with the effect of this medicine. Avoid alcoholic drinks. Your mouth may get dry. Chewing sugarless gum or sucking hard candy, and drinking plenty of water may help. Contact your doctor if the problem does not go away or is severe. What side effects may I notice from receiving this medicine? Side effects that you should report to your doctor or health care professional as soon as possible:  allergic reactions like skin rash, itching or hives, swelling of the face, lips, or tongue  anxious  black, tarry stools  changes in vision  confusion  elevated mood, decreased need for sleep, racing thoughts, impulsive behavior  eye pain  fast, irregular heartbeat  feeling faint or lightheaded, falls  feeling agitated,  angry, or irritable  hallucination, loss of contact with reality  loss of balance or coordination  loss of memory  painful or prolonged erections  restlessness, pacing, inability to keep still  seizures  stiff muscles  suicidal thoughts or other mood changes  trouble sleeping  unusual bleeding or bruising  unusually weak or tired  vomiting Side effects that usually do not require medical attention (report to your doctor or health care professional if they continue or are bothersome):  change in appetite or weight  change in sex drive or performance  diarrhea  increased sweating  indigestion, nausea  tremors This list may not describe all possible side effects. Call your doctor for medical advice about side effects. You may report side effects to FDA at 1-800-FDA-1088. Where should I keep my medicine? Keep out of the reach of children. Store at room temperature between 15 and 30 degrees C (59 and 86 degrees F). Throw away any unused medicine after the expiration date. NOTE: This sheet is a summary. It may not cover all possible information. If you have questions about this medicine, talk to your doctor, pharmacist, or health care provider.  2021 Elsevier/Gold Standard (2020-01-15 09:18:23)  

## 2020-07-02 DIAGNOSIS — S39012A Strain of muscle, fascia and tendon of lower back, initial encounter: Secondary | ICD-10-CM | POA: Diagnosis not present

## 2020-07-03 ENCOUNTER — Other Ambulatory Visit: Payer: Self-pay | Admitting: Psychiatry

## 2020-07-03 DIAGNOSIS — F411 Generalized anxiety disorder: Secondary | ICD-10-CM

## 2020-07-09 DIAGNOSIS — S39012A Strain of muscle, fascia and tendon of lower back, initial encounter: Secondary | ICD-10-CM | POA: Diagnosis not present

## 2020-07-16 ENCOUNTER — Other Ambulatory Visit: Payer: Self-pay

## 2020-07-16 ENCOUNTER — Telehealth (INDEPENDENT_AMBULATORY_CARE_PROVIDER_SITE_OTHER): Payer: BC Managed Care – PPO | Admitting: Psychiatry

## 2020-07-16 ENCOUNTER — Encounter: Payer: Self-pay | Admitting: Psychiatry

## 2020-07-16 DIAGNOSIS — F411 Generalized anxiety disorder: Secondary | ICD-10-CM

## 2020-07-16 DIAGNOSIS — S39012A Strain of muscle, fascia and tendon of lower back, initial encounter: Secondary | ICD-10-CM | POA: Diagnosis not present

## 2020-07-16 DIAGNOSIS — F32A Depression, unspecified: Secondary | ICD-10-CM

## 2020-07-16 MED ORDER — SERTRALINE HCL 25 MG PO TABS: 50.0000 mg | ORAL_TABLET | Freq: Every day | ORAL | 0 refills | Status: DC

## 2020-07-16 NOTE — Progress Notes (Signed)
Virtual Visit via Video Note  I connected with Jose Maddox on 07/16/20 at 11:20 AM EDT by a video enabled telemedicine application and verified that I am speaking with the correct person using two identifiers.  Location Provider Location : [facility] Patient Location : [place]  Participants: Patient , Provider   I discussed the limitations of evaluation and management by telemedicine and the availability of in person appointments. The patient expressed understanding and agreed to proceed.   I discussed the assessment and treatment plan with the patient. The patient was provided an opportunity to ask questions and all were answered. The patient agreed with the plan and demonstrated an understanding of the instructions.   The patient was advised to call back or seek an in-person evaluation if the symptoms worsen or if the condition fails to improve as anticipated.  BH MD OP Progress Note  07/16/2020 1:22 PM Jose Maddox  MRN:  [id number]  Chief Complaint:  Chief Complaint    Follow-up; Anxiety     HPI: Jose Maddox is a 50 year old Caucasian male, employed, married, lives in [place], has a history of gastroesophageal reflux disease, hyperlipidemia unspecified, anxiety disorder, male erectile disorder, nonscarring hair loss, cervicalgia was evaluated by telemedicine today.  Patient today reports he has been compliant with the Zoloft 25 mg.  He takes it in the morning at around 6:30 daily.  Patient reports he has not noticed major side effects.  He did have sexual side effects however nothing worrisome at this time.  He does report feeling fatigued and does have myalgia and pain in his back.  However he is not sure if that is related to the Zoloft since he already had arthralgia and pain previously.  He reports he is currently working with physical therapist every Friday and working on core exercises.  He reports his depression and anxiety symptoms may have improved on the Zoloft.  He has not  had any panic attacks.  Patient reports sleep is restless.  He has noticed that he has been waking up early some nights.  He does not know if it is due to the Zoloft or his pain.  He however will definitely monitor it.  Patient denies any suicidality, homicidality or perceptual disturbances.  Patient reports he was able to find a therapist and will start psychotherapy sessions soon.  Patient reports his appetite as low however he is eating sufficiently.  Patient denies any other concerns today.  Visit Diagnosis:    ICD-10-CM   1. GAD (generalized anxiety disorder)  F41.1 sertraline (ZOLOFT) 25 MG tablet  2. Depression, unspecified depression type  F32.A     Past Psychiatric History: I have reviewed past psychiatric history from progress note on 06/10/2020.  Past Medical History:  Past Medical History:  Diagnosis Date  . Anxiety   . GERD (gastroesophageal reflux disease)   . Hyperlipidemia   . IBS (irritable bowel syndrome)     Past Surgical History:  Procedure Laterality Date  . FORAMINOTOMY 1 LEVEL     C7  . WRIST SURGERY Left 2004    Family Psychiatric History: Reviewed family psychiatric history from progress note on 06/10/2020.  Family History:  Family History  Problem Relation Age of Onset  . Uterine cancer Other        Grandmother  . Arthritis Father   . Hyperlipidemia Father   . Alcohol abuse Father   . Alzheimer's disease Paternal Grandmother   . Alcoholism Paternal Grandfather   . Cirrhosis Paternal Grandfather   .  Alcohol abuse Paternal Grandfather   . Drug abuse Paternal Uncle   . Drug abuse Paternal Uncle     Social History: Reviewed social history from progress note 06/10/2020. Social History   Socioeconomic History  . Marital status: Married    Spouse name: Not on file  . Number of children: 2  . Years of education: Not on file  . Highest education level: Not on file  Occupational History  . Not on file  Tobacco Use  . Smoking status: Former  Smoker    Quit date: 06/10/2017    Years since quitting: 3.1  . Smokeless tobacco: Former Neurosurgeon    Types: Snuff    Quit date: 05/13/2017  Substance and Sexual Activity  . Alcohol use: Yes    Alcohol/week: 6.0 standard drinks    Types: 6 Cans of beer per week  . Drug use: Not on file    Comment: twice a year  . Sexual activity: Yes  Other Topics Concern  . Not on file  Social History Narrative  . Not on file   Social Determinants of Health   Financial Resource Strain: Not on file  Food Insecurity: Not on file  Transportation Needs: Not on file  Physical Activity: Not on file  Stress: Not on file  Social Connections: Not on file    Allergies: No Known Allergies  Metabolic Disorder Labs: No results found for: HGBA1C, MPG No results found for: PROLACTIN No results found for: CHOL, TRIG, HDL, CHOLHDL, VLDL, LDLCALC No results found for: TSH  Therapeutic Level Labs: No results found for: LITHIUM No results found for: VALPROATE No components found for:  CBMZ  Current Medications: Current Outpatient Medications  Medication Sig Dispense Refill  . finasteride (PROPECIA) 1 MG tablet Take 1 mg by mouth daily.    Marland Kitchen ibuprofen (ADVIL,MOTRIN) 200 MG tablet Take 200 mg by mouth every 6 (six) hours as needed.    . Misc. Devices MISC Compounding pharmacy cream - gaba 5%, keta 5%, keto 5%, lido 5%, amit 2%, carb cream  Apply daily prn pain. Dispense 4 ounces. 5 refills.    . pantoprazole (PROTONIX) 40 MG tablet Take 40 mg by mouth daily.    . sertraline (ZOLOFT) 25 MG tablet Take 2 tablets (50 mg total) by mouth daily with breakfast. 90 tablet 0  . tadalafil (CIALIS) 10 MG tablet Take 10 mg by mouth as needed.     No current facility-administered medications for this visit.     Musculoskeletal: Strength & Muscle Tone: UTA Gait & Station: UTA Patient leans: N/A  Psychiatric Specialty Exam: Review of Systems  Constitutional: Positive for fatigue.  Musculoskeletal: Positive for  arthralgias.  Psychiatric/Behavioral: Positive for sleep disturbance. The patient is nervous/anxious.   All other systems reviewed and are negative.   There were no vitals taken for this visit.There is no height or weight on file to calculate BMI.  General Appearance: Casual  Eye Contact:  Fair  Speech:  Clear and Coherent  Volume:  Normal  Mood:  Anxious Improving  Affect:  Appropriate  Thought Process:  Goal Directed and Descriptions of Associations: Intact  Orientation:  Full (Time, Place, and Person)  Thought Content: Logical   Suicidal Thoughts:  No  Homicidal Thoughts:  No  Memory:  Immediate;   Fair Recent;   Fair Remote;   Fair  Judgement:  Fair  Insight:  Fair  Psychomotor Activity:  Normal  Concentration:  Concentration: Fair and Attention Span: Fair  Recall:  Fair  [employer] of Knowledge: Fair  Language: Fair  Akathisia:  No  Handed:  Right  AIMS (if indicated): UTA  Assets:  Communication Skills Desire for Improvement Housing Social Support  ADL's:  Intact  Cognition: WNL  Sleep:  Restless   Screenings: GAD-7   Flowsheet Row [facility]  Total GAD-7 Score 14    PHQ2-9   Flowsheet Row Video Visit from 07/16/2020 in [facility] Video Visit from 06/10/2020 in [facility]  PHQ-2 Total Score 1 3  PHQ-9 Total Score 7 12    Flowsheet Row Video Visit from 06/10/2020 in [facility]  C-SSRS RISK CATEGORY No Risk       Assessment and Plan: Jose Maddox is a 50 year old Caucasian male, employed, lives in [place], married, has a history of gastroesophageal reflux disease, hyperlipidemia, anxiety disorder, hair loss, erectile disorder was evaluated by telemedicine today.  Patient is currently making progress.  Plan as noted below.  Plan GAD-improving Increase sertraline to 50 mg p.o. daily.  Patient advised to take the  sertraline at the end of the day if he believes it is causing tiredness.  However he should monitor his sleep to make sure it does not affect his sleep. Patient starting psychotherapy sessions and has upcoming appointment with therapist. Patient advised to sign a release to coordinate care.  Depressive disorder unspecified-improving PHQ-9 today equals 7 Continue sertraline. Continue CBT  Rule out alcohol use disorder-patient reports he has been cutting back on alcohol.  Will monitor.  Follow-up in clinic in 6 weeks or sooner if needed.  This note was generated in part or whole with voice recognition software. Voice recognition is usually quite accurate but there are transcription errors that can and very often do occur. I apologize for any typographical errors that were not detected and corrected.       Jomarie Longs, MD 07/16/2020, 1:22 PM

## 2020-07-23 DIAGNOSIS — S39012A Strain of muscle, fascia and tendon of lower back, initial encounter: Secondary | ICD-10-CM | POA: Diagnosis not present

## 2020-07-30 DIAGNOSIS — S39012A Strain of muscle, fascia and tendon of lower back, initial encounter: Secondary | ICD-10-CM | POA: Diagnosis not present

## 2020-08-03 ENCOUNTER — Telehealth: Payer: BC Managed Care – PPO | Admitting: Psychiatry

## 2020-08-06 DIAGNOSIS — S39012A Strain of muscle, fascia and tendon of lower back, initial encounter: Secondary | ICD-10-CM | POA: Diagnosis not present

## 2020-08-20 DIAGNOSIS — F411 Generalized anxiety disorder: Secondary | ICD-10-CM | POA: Diagnosis not present

## 2020-08-26 ENCOUNTER — Other Ambulatory Visit: Payer: Self-pay | Admitting: Orthopedic Surgery

## 2020-08-26 DIAGNOSIS — M5416 Radiculopathy, lumbar region: Secondary | ICD-10-CM

## 2020-08-27 DIAGNOSIS — S39012A Strain of muscle, fascia and tendon of lower back, initial encounter: Secondary | ICD-10-CM | POA: Diagnosis not present

## 2020-08-29 ENCOUNTER — Other Ambulatory Visit: Payer: BC Managed Care – PPO

## 2020-08-29 ENCOUNTER — Ambulatory Visit
Admission: RE | Admit: 2020-08-29 | Discharge: 2020-08-29 | Disposition: A | Discharge status: Home / Self Care | Payer: BC Managed Care – PPO | Source: Ambulatory Visit | Attending: Orthopedic Surgery | Admitting: Orthopedic Surgery

## 2020-08-29 DIAGNOSIS — M545 Low back pain, unspecified: Secondary | ICD-10-CM | POA: Diagnosis not present

## 2020-08-29 DIAGNOSIS — M5416 Radiculopathy, lumbar region: Secondary | ICD-10-CM

## 2020-08-29 DIAGNOSIS — M48061 Spinal stenosis, lumbar region without neurogenic claudication: Secondary | ICD-10-CM | POA: Diagnosis not present

## 2020-08-30 ENCOUNTER — Telehealth: Payer: Self-pay

## 2020-08-30 DIAGNOSIS — F411 Generalized anxiety disorder: Secondary | ICD-10-CM

## 2020-08-30 DIAGNOSIS — F32A Depression, unspecified: Secondary | ICD-10-CM

## 2020-08-30 MED ORDER — SERTRALINE HCL 50 MG PO TABS: 50.0000 mg | ORAL_TABLET | Freq: Every day | ORAL | 0 refills | Status: DC

## 2020-08-30 NOTE — Telephone Encounter (Signed)
I have sent sertraline 50 mg to pharmacy.

## 2020-08-30 NOTE — Telephone Encounter (Signed)
pt called left message that he needs a rx sent in with the new dosage of the sertraline. 50mg 

## 2020-09-03 ENCOUNTER — Encounter: Payer: Self-pay | Admitting: Psychiatry

## 2020-09-03 ENCOUNTER — Other Ambulatory Visit: Payer: Self-pay

## 2020-09-03 ENCOUNTER — Telehealth (INDEPENDENT_AMBULATORY_CARE_PROVIDER_SITE_OTHER): Payer: BC Managed Care – PPO | Admitting: Psychiatry

## 2020-09-03 DIAGNOSIS — M533 Sacrococcygeal disorders, not elsewhere classified: Secondary | ICD-10-CM | POA: Diagnosis not present

## 2020-09-03 DIAGNOSIS — G4700 Insomnia, unspecified: Secondary | ICD-10-CM

## 2020-09-03 DIAGNOSIS — F32A Depression, unspecified: Secondary | ICD-10-CM

## 2020-09-03 DIAGNOSIS — F411 Generalized anxiety disorder: Secondary | ICD-10-CM

## 2020-09-03 MED ORDER — SERTRALINE HCL 50 MG PO TABS: 75.0000 mg | ORAL_TABLET | Freq: Every day | ORAL | 0 refills | Status: DC

## 2020-09-03 MED ORDER — ZOLPIDEM TARTRATE 5 MG PO TABS: 2.5000 mg | ORAL_TABLET | Freq: Every evening | ORAL | 1 refills | Status: DC | PRN

## 2020-09-03 NOTE — Progress Notes (Signed)
Virtual Visit via Video Note  I connected with Jose Maddox on 09/03/20 at  9:00 AM EDT by a video enabled telemedicine application and verified that I am speaking with the correct person using two identifiers.  Location Provider Location : [facility] Patient Location : Home  Participants: Patient , Provider   I discussed the limitations of evaluation and management by telemedicine and the availability of in person appointments. The patient expressed understanding and agreed to proceed.    I discussed the assessment and treatment plan with the patient. The patient was provided an opportunity to ask questions and all were answered. The patient agreed with the plan and demonstrated an understanding of the instructions.   The patient was advised to call back or seek an in-person evaluation if the symptoms worsen or if the condition fails to improve as anticipated.   BH MD OP Progress Note  09/03/2020 9:36 AM Jose Maddox  MRN:  [id number]  Chief Complaint:  Chief Complaint   Follow-up; Anxiety    HPI: Jose Maddox is a 50 year old Caucasian male, employed, married, lives in [place], has a history of generalized anxiety disorder, depression unspecified, insomnia, gastroesophageal reflux disease, hyperlipidemia, male erectile disorder, nonscarring hair loss, was evaluated by telemedicine today.  Patient reports he is currently taking the Zoloft 50 mg daily.  He denies any significant side effects.  He reports he has noticed some benefit from the Zoloft.  He however continues to struggle with lack of motivation, some concentration problems although it does not affect his work or his daily functioning, appetite variation, anxiety symptoms like feeling nervous or worrying.  Patient reports he has established care with a psychotherapist and is planning to see his therapist every 2 weeks or so.  Patient reports sleep as restless likely due to his back pain.  He has been struggling with pain  since the past 3 months or so.  He does have a history of arthritis.  He reports pain gets exacerbated when he works.  He also has arthritis of his hands which does affect him on and off.  Patient is currently on meloxicam as well as methocarbamol.  He however has not tried the methocarbamol yet and is willing to give it a trial at bedtime to see if that would help him to rest better.  Most days he wakes up feeling unrested.  This usually happens on a week days  Patient reports on weekends when he is able to relax more his sleep is better.  He hence tries to catch up on his sleep on weekends.  Patient reports he tries to follow a good sleep hygiene.  He does not drink a lot of caffeinated drinks and tries to drink it before 1 PM.  Patient drinks alcohol a couple of beers every weekend or so.  His appetite varies.  Some days he has good appetite and some days he skips his dinner mostly because he had a good lunch.  However he reports he make sure he eats sufficiently.  Patient denies any suicidality.  Did not express any homicidality or perceptual disturbances.  Patient denies any other concerns today.    Visit Diagnosis:    ICD-10-CM   1. GAD (generalized anxiety disorder)  F41.1 sertraline (ZOLOFT) 50 MG tablet    2. Depression, unspecified depression type  F32.A sertraline (ZOLOFT) 50 MG tablet    3. Insomnia, unspecified type  G47.00 zolpidem (AMBIEN) 5 MG tablet      Past Psychiatric History: I have reviewed  past psychiatric history from progress note on 06/10/2020  Past Medical History:  Past Medical History:  Diagnosis Date   Anxiety    GERD (gastroesophageal reflux disease)    Hyperlipidemia    IBS (irritable bowel syndrome)     Past Surgical History:  Procedure Laterality Date   FORAMINOTOMY 1 LEVEL     C7   WRIST SURGERY Left 2004    Family Psychiatric History: Reviewed family psychiatric history from progress note on 06/10/2020  Family History:  Family History   Problem Relation Age of Onset   Uterine cancer Other        Grandmother   Arthritis Father    Hyperlipidemia Father    Alcohol abuse Father    Alzheimer's disease Paternal Grandmother    Alcoholism Paternal Grandfather    Cirrhosis Paternal Grandfather    Alcohol abuse Paternal Grandfather    Drug abuse Paternal Uncle    Drug abuse Paternal Uncle     Social History: Reviewed social history from progress note on 06/10/2020 Social History   Socioeconomic History   Marital status: Married    Spouse name: Not on file   Number of children: 2   Years of education: Not on file   Highest education level: Not on file  Occupational History   Not on file  Tobacco Use   Smoking status: Former    Pack years: 0.00   Smokeless tobacco: Former    Types: Snuff    Quit date: 05/13/2017  Substance and Sexual Activity   Alcohol use: Yes    Alcohol/week: 6.0 standard drinks    Types: 6 Cans of beer per week   Drug use: Not on file    Comment: twice a year   Sexual activity: Yes  Other Topics Concern   Not on file  Social History Narrative   Not on file   Social Determinants of Health   Financial Resource Strain: Not on file  Food Insecurity: Not on file  Transportation Needs: Not on file  Physical Activity: Not on file  Stress: Not on file  Social Connections: Not on file    Allergies: No Known Allergies  Metabolic Disorder Labs: No results found for: HGBA1C, MPG No results found for: PROLACTIN No results found for: CHOL, TRIG, HDL, CHOLHDL, VLDL, LDLCALC No results found for: TSH  Therapeutic Level Labs: No results found for: LITHIUM No results found for: VALPROATE No components found for:  CBMZ  Current Medications: Current Outpatient Medications  Medication Sig Dispense Refill   zolpidem (AMBIEN) 5 MG tablet Take 0.5-1 tablets (2.5-5 mg total) by mouth at bedtime as needed for sleep. 30 tablet 1   finasteride (PROPECIA) 1 MG tablet Take 1 mg by mouth daily.      ibuprofen (ADVIL,MOTRIN) 200 MG tablet Take 200 mg by mouth every 6 (six) hours as needed.     meloxicam (MOBIC) 15 MG tablet Take 1 tablet by mouth daily.     methocarbamol (ROBAXIN) 500 MG tablet Take by mouth.     Misc. Devices MISC Compounding pharmacy cream - gaba 5%, keta 5%, keto 5%, lido 5%, amit 2%, carb cream  Apply daily prn pain. Dispense 4 ounces. 5 refills.     pantoprazole (PROTONIX) 40 MG tablet Take 40 mg by mouth daily.     sertraline (ZOLOFT) 50 MG tablet Take 1.5 tablets (75 mg total) by mouth daily. 135 tablet 0   tadalafil (CIALIS) 10 MG tablet Take 10 mg by mouth as needed.  No current facility-administered medications for this visit.     Musculoskeletal: Strength & Muscle Tone:  UTA Gait & Station:  UTA Patient leans: N/A  Psychiatric Specialty Exam: Review of Systems  Musculoskeletal:  Positive for arthralgias and back pain.  Psychiatric/Behavioral:  Positive for decreased concentration, dysphoric mood and sleep disturbance. The patient is nervous/anxious.   All other systems reviewed and are negative.  There were no vitals taken for this visit.There is no height or weight on file to calculate BMI.  General Appearance: Casual  Eye Contact:  Good  Speech:  Normal Rate  Volume:  Normal  Mood:  Anxious and Dysphoric  Affect:  Congruent  Thought Process:  Goal Directed and Descriptions of Associations: Intact  Orientation:  Full (Time, Place, and Person)  Thought Content: Logical   Suicidal Thoughts:  No  Homicidal Thoughts:  No  Memory:  Immediate;   Fair Recent;   Fair Remote;   Fair  Judgement:  Fair  Insight:  Fair  Psychomotor Activity:  Normal  Concentration:  Concentration: Fair and Attention Span: Fair  Recall:  Good  Fund of Knowledge: Good  Language: Good  Akathisia:  No  Handed:  Right  AIMS (if indicated): not done  Assets:  Communication Skills Desire for Improvement Financial  Resources/Insurance Housing Intimacy Resilience Social Support Talents/Skills Transportation Vocational/Educational  ADL's:  Intact  Cognition: WNL  Sleep:   Restless   Screenings: GAD-7    Flowsheet Row Video Visit from 09/03/2020 in [facility] Video Visit from 06/10/2020 in [facility]  Total GAD-7 Score 2 14      PHQ2-9    Flowsheet Row Video Visit from 09/03/2020 in [facility] Video Visit from 07/16/2020 in [facility] Video Visit from 06/10/2020 in [facility]  PHQ-2 Total Score 2 1 3   PHQ-9 Total Score -- 7 12      Flowsheet Row Video Visit from 06/10/2020 in [facility]  C-SSRS RISK CATEGORY No Risk        Assessment and Plan: Jose Maddox is a 50 year old Caucasian male, employed, lives in [place], married, has a history of gastroesophageal reflux disease, hyperlipidemia, anxiety disorder, hair loss, erectile disorder was evaluated by telemedicine today.  Patient is currently making some progress however will continue to benefit from dosage readjustment and psychotherapy sessions.  Plan GAD-improving Increase sertraline to 75 mg p.o. daily Continue CBT with therapist. Patient advised to sign a release to coordinate care.  Depression disorder unspecified-improving Increase sertraline to 75 mg p.o. daily  Insomnia-unstable Start Ambien 5 mg p.o. nightly as needed Provided medication education.  Advised to avoid combining with alcohol. Discussed sleep hygiene. Patient will benefit from sufficient pain management.  Follow-up in clinic in 6 - 8  weeks or sooner if needed.  This note was generated in part or whole with voice recognition software. Voice recognition is usually quite accurate but there are transcription errors that can and very often do occur. I apologize for any  typographical errors that were not detected and corrected.     [place], MD 09/03/2020, 9:36 AM

## 2020-09-08 ENCOUNTER — Other Ambulatory Visit: Payer: Self-pay | Admitting: Orthopedic Surgery

## 2020-09-08 DIAGNOSIS — M545 Low back pain, unspecified: Secondary | ICD-10-CM

## 2020-09-24 DIAGNOSIS — F411 Generalized anxiety disorder: Secondary | ICD-10-CM | POA: Diagnosis not present

## 2020-09-30 ENCOUNTER — Other Ambulatory Visit: Payer: Self-pay | Admitting: Psychiatry

## 2020-09-30 DIAGNOSIS — F411 Generalized anxiety disorder: Secondary | ICD-10-CM

## 2020-10-21 ENCOUNTER — Telehealth: Payer: Self-pay

## 2020-10-21 NOTE — Telephone Encounter (Signed)
Sertraline was sent to the pharmacy on 6/17 2022-90-day supply.  Patient is not due for refills at this time.

## 2020-10-21 NOTE — Telephone Encounter (Signed)
pt called left a message that he is having a hip injection tomorrow so he had to r/s his appt. he said that he will not have enough medication to get to his r/s appt.

## 2020-10-22 ENCOUNTER — Other Ambulatory Visit: Payer: BC Managed Care – PPO

## 2020-10-22 ENCOUNTER — Telehealth: Payer: BC Managed Care – PPO | Admitting: Psychiatry

## 2020-10-22 ENCOUNTER — Inpatient Hospital Stay: Admission: RE | Admit: 2020-10-22 | Payer: BC Managed Care – PPO | Source: Ambulatory Visit

## 2020-10-29 DIAGNOSIS — F411 Generalized anxiety disorder: Secondary | ICD-10-CM | POA: Diagnosis not present

## 2020-11-10 ENCOUNTER — Telehealth (INDEPENDENT_AMBULATORY_CARE_PROVIDER_SITE_OTHER): Payer: BC Managed Care – PPO | Admitting: Psychiatry

## 2020-11-10 ENCOUNTER — Other Ambulatory Visit: Payer: Self-pay

## 2020-11-10 ENCOUNTER — Encounter: Payer: Self-pay | Admitting: Psychiatry

## 2020-11-10 DIAGNOSIS — G4700 Insomnia, unspecified: Secondary | ICD-10-CM | POA: Insufficient documentation

## 2020-11-10 DIAGNOSIS — G4701 Insomnia due to medical condition: Secondary | ICD-10-CM

## 2020-11-10 DIAGNOSIS — F411 Generalized anxiety disorder: Secondary | ICD-10-CM | POA: Diagnosis not present

## 2020-11-10 DIAGNOSIS — F32A Depression, unspecified: Secondary | ICD-10-CM | POA: Diagnosis not present

## 2020-11-10 MED ORDER — SERTRALINE HCL 50 MG PO TABS: 100.0000 mg | ORAL_TABLET | Freq: Every day | ORAL | 0 refills | Status: DC

## 2020-11-10 NOTE — Progress Notes (Signed)
Virtual Visit via Video Note  I connected with Jose Maddox on 11/10/20 at  1:30 PM EDT by a video enabled telemedicine application and verified that I am speaking with the correct person using two identifiers.  Location Provider Location : [facility] Patient Location : Home  Participants: Patient , Provider   I discussed the limitations of evaluation and management by telemedicine and the availability of in person appointments. The patient expressed understanding and agreed to proceed.    I discussed the assessment and treatment plan with the patient. The patient was provided an opportunity to ask questions and all were answered. The patient agreed with the plan and demonstrated an understanding of the instructions.   The patient was advised to call back or seek an in-person evaluation if the symptoms worsen or if the condition fails to improve as anticipated.   BH MD OP Progress Note  11/10/2020 4:44 PM Deaken Jurgens  MRN:  [id number]  Chief Complaint:  Chief Complaint   Follow-up; Anxiety; Depression; Insomnia    HPI: Jose Maddox is a 50 year old Caucasian male, employed, married, lives in [place], has a history of GAD, depression unspecified, insomnia, gastroesophageal reflux disease, hyperlipidemia, manage erectile disorder, nonscarring hair loss was evaluated by telemedicine today.  Patient today reports he is currently compliant on the Zoloft 75 mg daily.  He has not noticed any significant side effects.  He believes his anxiety symptoms have improved a lot.  Patient reports certain situations that may have made his anxiety to get worse in the past does not do that anymore.  He is able to cope with certain situations better than before.  Patient denies any sadness, motivational problems.  He continues to have low energy on and off however that has improved as well.  Patient reports his pain, hip joint is better.  He did not get the hip joint injection and is still  waiting.  Patient had COVID-19 infection few weeks ago, had only mild symptoms.  Patient reports work is going well.  Patient denies any suicidality, homicidality or perceptual disturbances.  Patient denies any other concerns today.    Visit Diagnosis:    ICD-10-CM   1. GAD (generalized anxiety disorder)  F41.1 sertraline (ZOLOFT) 50 MG tablet    2. Depression, unspecified depression type  F32.A sertraline (ZOLOFT) 50 MG tablet    3. Insomnia due to medical condition  G47.01    pain,anxiety      Past Psychiatric History: Reviewed past psychiatric history from progress note on 06/10/2020.  Past Medical History:  Past Medical History:  Diagnosis Date   Anxiety    GERD (gastroesophageal reflux disease)    Hyperlipidemia    IBS (irritable bowel syndrome)     Past Surgical History:  Procedure Laterality Date   FORAMINOTOMY 1 LEVEL     C7   WRIST SURGERY Left 2004    Family Psychiatric History: Reviewed family psychiatric history from progress note on 06/10/2020.  Family History:  Family History  Problem Relation Age of Onset   Uterine cancer Other        Grandmother   Arthritis Father    Hyperlipidemia Father    Alcohol abuse Father    Alzheimer's disease Paternal Grandmother    Alcoholism Paternal Grandfather    Cirrhosis Paternal Grandfather    Alcohol abuse Paternal Grandfather    Drug abuse Paternal Uncle    Drug abuse Paternal Uncle     Social History: Reviewed social history from progress note on 06/10/2020. Social  History   Socioeconomic History   Marital status: Married    Spouse name: Not on file   Number of children: 2   Years of education: Not on file   Highest education level: Not on file  Occupational History   Not on file  Tobacco Use   Smoking status: Former   Smokeless tobacco: Former    Types: Snuff    Quit date: 05/13/2017  Substance and Sexual Activity   Alcohol use: Yes    Alcohol/week: 6.0 standard drinks    Types: 6 Cans of  beer per week   Drug use: Not on file    Comment: twice a year   Sexual activity: Yes  Other Topics Concern   Not on file  Social History Narrative   Not on file   Social Determinants of Health   Financial Resource Strain: Not on file  Food Insecurity: Not on file  Transportation Needs: Not on file  Physical Activity: Not on file  Stress: Not on file  Social Connections: Not on file    Allergies: No Known Allergies  Metabolic Disorder Labs: No results found for: HGBA1C, MPG No results found for: PROLACTIN No results found for: CHOL, TRIG, HDL, CHOLHDL, VLDL, LDLCALC No results found for: TSH  Therapeutic Level Labs: No results found for: LITHIUM No results found for: VALPROATE No components found for:  CBMZ  Current Medications: Current Outpatient Medications  Medication Sig Dispense Refill   finasteride (PROPECIA) 1 MG tablet Take 1 mg by mouth daily.     finasteride (PROSCAR) 5 MG tablet Take 5 mg by mouth daily.     ibuprofen (ADVIL,MOTRIN) 200 MG tablet Take 200 mg by mouth every 6 (six) hours as needed.     LAGEVRIO 200 MG CAPS SMARTSIG:4 Capsule(s) By Mouth Every 12 Hours     meloxicam (MOBIC) 15 MG tablet Take 1 tablet by mouth daily.     methocarbamol (ROBAXIN) 500 MG tablet Take by mouth.     Misc. Devices MISC Compounding pharmacy cream - gaba 5%, keta 5%, keto 5%, lido 5%, amit 2%, carb cream  Apply daily prn pain. Dispense 4 ounces. 5 refills.     pantoprazole (PROTONIX) 40 MG tablet Take 40 mg by mouth daily.     sertraline (ZOLOFT) 50 MG tablet Take 2 tablets (100 mg total) by mouth daily. 180 tablet 0   tadalafil (CIALIS) 10 MG tablet Take 10 mg by mouth as needed.     zolpidem (AMBIEN) 5 MG tablet Take 0.5-1 tablets (2.5-5 mg total) by mouth at bedtime as needed for sleep. 30 tablet 1   No current facility-administered medications for this visit.     Musculoskeletal: Strength & Muscle Tone:  UTA Gait & Station: normal Patient leans:  N/A  Psychiatric Specialty Exam: Review of Systems  Musculoskeletal:        Hip joint pain - better  Psychiatric/Behavioral:  The patient is nervous/anxious.    There were no vitals taken for this visit.There is no height or weight on file to calculate BMI.  General Appearance: Casual  Eye Contact:  Good  Speech:  Clear and Coherent  Volume:  Normal  Mood:  Anxious  Affect:  Congruent  Thought Process:  Goal Directed and Descriptions of Associations: Intact  Orientation:  Full (Time, Place, and Person)  Thought Content: Logical   Suicidal Thoughts:  No  Homicidal Thoughts:  No  Memory:  Immediate;   Fair Recent;   Fair Remote;   Fair  Judgement:  Fair  Insight:  Fair  Psychomotor Activity:  Normal  Concentration:  Concentration: Fair and Attention Span: Fair  Recall:  [employer] of Knowledge: Fair  Language: Fair  Akathisia:  No  Handed:  Right  AIMS (if indicated): not done  Assets:  Communication Skills Desire for Improvement Housing Social Support  ADL's:  Intact  Cognition: WNL  Sleep:  Fair   Screenings: GAD-7    Flowsheet Row Video Visit from 11/10/2020 in [facility] Video Visit from 09/03/2020 in [facility] Video Visit from 06/10/2020 in [facility]  Total GAD-7 Score 7 2 14       PHQ2-9    Flowsheet Row Video Visit from 11/10/2020 in [facility] Video Visit from 09/03/2020 in [facility] Video Visit from 07/16/2020 in [facility] Video Visit from 06/10/2020 in [facility]  PHQ-2 Total Score 1 2 1 3   PHQ-9 Total Score 6 -- 7 12      Flowsheet Row Video Visit from 06/10/2020 in [facility]  C-SSRS RISK CATEGORY No Risk        Assessment and Plan: Jose Maddox is a 50 year old Caucasian male, employed, lives in [place],  married, has a history of gastroesophageal reflux disease, hyperlipidemia, anxiety disorder, hair loss, erectile dysfunction was evaluated by telemedicine today.  Patient is currently improving on the current medication, however is interested in dosage increase of Zoloft.  Plan as noted below.  Plan GAD-improving Increase sertraline to 100 mg p.o. daily. However if he has any side effects he could reduce it back to 75 mg p.o. daily.  Provided medication education. Continue CBT with therapist. Patient follows up with [phone] #[phone], extension [phone].  We will coordinate care.  Depression unspecified-improving Zoloft as prescribed  Insomnia-improving Ambien 5 mg p.o. nightly as needed Continue sleep hygiene techniques Patient will continue to need sufficient pain management.  Follow-up in clinic in 8 weeks or sooner if needed.  This note was generated in part or whole with voice recognition software. Voice recognition is usually quite accurate but there are transcription errors that can and very often do occur. I apologize for any typographical errors that were not detected and corrected.     Clabe Seal, MD 11/10/2020, 4:44 PM

## 2020-11-21 ENCOUNTER — Other Ambulatory Visit: Payer: Self-pay | Admitting: Psychiatry

## 2020-11-21 DIAGNOSIS — F32A Depression, unspecified: Secondary | ICD-10-CM

## 2020-11-21 DIAGNOSIS — F411 Generalized anxiety disorder: Secondary | ICD-10-CM

## 2020-11-26 DIAGNOSIS — F411 Generalized anxiety disorder: Secondary | ICD-10-CM | POA: Diagnosis not present

## 2021-01-03 DIAGNOSIS — K529 Noninfective gastroenteritis and colitis, unspecified: Secondary | ICD-10-CM | POA: Diagnosis not present

## 2021-01-03 DIAGNOSIS — M2550 Pain in unspecified joint: Secondary | ICD-10-CM | POA: Diagnosis not present

## 2021-01-07 ENCOUNTER — Other Ambulatory Visit: Payer: Self-pay

## 2021-01-07 ENCOUNTER — Telehealth: Payer: BC Managed Care – PPO | Admitting: Psychiatry

## 2021-01-28 DIAGNOSIS — Z125 Encounter for screening for malignant neoplasm of prostate: Secondary | ICD-10-CM | POA: Diagnosis not present

## 2021-01-28 DIAGNOSIS — E785 Hyperlipidemia, unspecified: Secondary | ICD-10-CM | POA: Diagnosis not present

## 2021-02-04 DIAGNOSIS — Z0000 Encounter for general adult medical examination without abnormal findings: Secondary | ICD-10-CM | POA: Diagnosis not present

## 2021-02-04 DIAGNOSIS — E785 Hyperlipidemia, unspecified: Secondary | ICD-10-CM | POA: Diagnosis not present

## 2021-02-04 DIAGNOSIS — Z1339 Encounter for screening examination for other mental health and behavioral disorders: Secondary | ICD-10-CM | POA: Diagnosis not present

## 2021-02-04 DIAGNOSIS — Z1331 Encounter for screening for depression: Secondary | ICD-10-CM | POA: Diagnosis not present

## 2021-02-18 ENCOUNTER — Encounter: Payer: Self-pay | Admitting: Psychiatry

## 2021-02-18 ENCOUNTER — Telehealth (INDEPENDENT_AMBULATORY_CARE_PROVIDER_SITE_OTHER): Payer: BC Managed Care – PPO | Admitting: Psychiatry

## 2021-02-18 ENCOUNTER — Other Ambulatory Visit: Payer: Self-pay

## 2021-02-18 DIAGNOSIS — G4701 Insomnia due to medical condition: Secondary | ICD-10-CM

## 2021-02-18 DIAGNOSIS — F411 Generalized anxiety disorder: Secondary | ICD-10-CM | POA: Diagnosis not present

## 2021-02-18 MED ORDER — DULOXETINE HCL 30 MG PO CPEP: 30.0000 mg | ORAL_CAPSULE | Freq: Every day | ORAL | 1 refills | Status: DC

## 2021-02-18 NOTE — Progress Notes (Signed)
Virtual Visit via Video Note  I connected with Jose Maddox on 02/18/21 at  9:20 AM EST by a video enabled telemedicine application and verified that I am speaking with the correct person using two identifiers.  Location Provider Location : [facility] Patient Location : Home  Participants: Patient , Provider   I discussed the limitations of evaluation and management by telemedicine and the availability of in person appointments. The patient expressed understanding and agreed to proceed.    I discussed the assessment and treatment plan with the patient. The patient was provided an opportunity to ask questions and all were answered. The patient agreed with the plan and demonstrated an understanding of the instructions.   The patient was advised to call back or seek an in-person evaluation if the symptoms worsen or if the condition fails to improve as anticipated.    BH MD OP Progress Note  02/18/2021 12:10 PM Jose Maddox  MRN:  [id number]  Chief Complaint:  Chief Complaint   Follow-up; Anxiety; Depression    HPI: Jose Maddox is a 50 year old Caucasian male, employed, married, lives in [place], has a history of GAD, insomnia, gastroesophageal reflux disease, hyperlipidemia, erectile disorder, nonscarring hair loss, was evaluated by telemedicine today.  Patient today reports since being on the higher dosage of sertraline he started noticing worsening adverse side effects like myalgia, joint pain, fatigue.  Patient reports he has a history of having joint pain and is currently scheduled to see a rheumatologist.  Patient however reports he felt the sertraline could be making it worse.  He has reduced the sertraline and currently takes one third of the 100 mg tablet.  Since doing so the side effects may have gotten better.  In the past 2 weeks or so he has been feeling better.  Patient reports however he continues to have episodes of irritability, low frustration tolerance, anxiety.   He is interested in starting a medication like duloxetine if possible (recommended by his primary care provider) to replace the sertraline.  Patient reports sleep is overall good.  Reports he had a good Thanksgiving break with his family.  Patient reports he has been cutting back on his alcohol use.  He noticed that when he was on the higher dosage of sertraline his craving for alcohol had also gotten worse.  Now that he is on a lower dosage he does not struggle with that anymore.  Denies any suicidality or homicidality and perceptual disturbances.  Patient denies any other concerns today.  Visit Diagnosis:    ICD-10-CM   1. GAD (generalized anxiety disorder)  F41.1 DULoxetine (CYMBALTA) 30 MG capsule    2. Insomnia due to medical condition  G47.01    pain, anxiety      Past Psychiatric History: Reviewed past psychiatric history from progress note on 06/10/2020.  Past Medical History:  Past Medical History:  Diagnosis Date   Anxiety    GERD (gastroesophageal reflux disease)    Hyperlipidemia    IBS (irritable bowel syndrome)     Past Surgical History:  Procedure Laterality Date   FORAMINOTOMY 1 LEVEL     C7   WRIST SURGERY Left 2004    Family Psychiatric History: Reviewed family psychiatric history from progress note on 06/10/2020.  Family History:  Family History  Problem Relation Age of Onset   Uterine cancer Other        Grandmother   Arthritis Father    Hyperlipidemia Father    Alcohol abuse Father    Alzheimer's disease  Paternal Grandmother    Alcoholism Paternal Grandfather    Cirrhosis Paternal Grandfather    Alcohol abuse Paternal Grandfather    Drug abuse Paternal Uncle    Drug abuse Paternal Uncle     Social History: Reviewed social history from progress note on 06/10/2020. Social History   Socioeconomic History   Marital status: Married    Spouse name: Not on file   Number of children: 2   Years of education: Not on file   Highest education  level: Not on file  Occupational History   Not on file  Tobacco Use   Smoking status: Former   Smokeless tobacco: Former    Types: Snuff    Quit date: 05/13/2017  Substance and Sexual Activity   Alcohol use: Yes    Alcohol/week: 6.0 standard drinks    Types: 6 Cans of beer per week   Drug use: Not on file    Comment: twice a year   Sexual activity: Yes  Other Topics Concern   Not on file  Social History Narrative   Not on file   Social Determinants of Health   Financial Resource Strain: Not on file  Food Insecurity: Not on file  Transportation Needs: Not on file  Physical Activity: Not on file  Stress: Not on file  Social Connections: Not on file    Allergies: No Known Allergies  Metabolic Disorder Labs: No results found for: HGBA1C, MPG No results found for: PROLACTIN No results found for: CHOL, TRIG, HDL, CHOLHDL, VLDL, LDLCALC No results found for: TSH  Therapeutic Level Labs: No results found for: LITHIUM No results found for: VALPROATE No components found for:  CBMZ  Current Medications: Current Outpatient Medications  Medication Sig Dispense Refill   DULoxetine (CYMBALTA) 30 MG capsule Take 1 capsule (30 mg total) by mouth daily. Stop Sertraline 30 capsule 1   finasteride (PROPECIA) 1 MG tablet Take 1 mg by mouth daily.     finasteride (PROSCAR) 5 MG tablet Take 5 mg by mouth daily.     ibuprofen (ADVIL,MOTRIN) 200 MG tablet Take 200 mg by mouth every 6 (six) hours as needed.     LAGEVRIO 200 MG CAPS SMARTSIG:4 Capsule(s) By Mouth Every 12 Hours     meloxicam (MOBIC) 15 MG tablet Take 1 tablet by mouth daily.     methocarbamol (ROBAXIN) 500 MG tablet Take by mouth.     Misc. Devices MISC Compounding pharmacy cream - gaba 5%, keta 5%, keto 5%, lido 5%, amit 2%, carb cream  Apply daily prn pain. Dispense 4 ounces. 5 refills.     pantoprazole (PROTONIX) 40 MG tablet Take 40 mg by mouth daily.     tadalafil (CIALIS) 10 MG tablet Take 10 mg by mouth as needed.      zolpidem (AMBIEN) 5 MG tablet Take 0.5-1 tablets (2.5-5 mg total) by mouth at bedtime as needed for sleep. 30 tablet 1   No current facility-administered medications for this visit.     Musculoskeletal: Strength & Muscle Tone:  UTA Gait & Station:  Seated Patient leans: N/A  Psychiatric Specialty Exam: Review of Systems  Musculoskeletal:  Positive for myalgias.  Psychiatric/Behavioral:  The patient is nervous/anxious.   All other systems reviewed and are negative.  There were no vitals taken for this visit.There is no height or weight on file to calculate BMI.  General Appearance: Casual  Eye Contact:  Fair  Speech:  Clear and Coherent  Volume:  Normal  Mood:  Anxious  Affect:  Congruent  Thought Process:  Goal Directed and Descriptions of Associations: Intact  Orientation:  Full (Time, Place, and Person)  Thought Content: Logical   Suicidal Thoughts:  No  Homicidal Thoughts:  No  Memory:  Immediate;   Fair Recent;   Fair Remote;   Fair  Judgement:  Fair  Insight:  Fair  Psychomotor Activity:  Normal  Concentration:  Concentration: Fair and Attention Span: Fair  Recall:  [employer] of Knowledge: Fair  Language: Fair  Akathisia:  No  Handed:  Right  AIMS (if indicated): done,0  Assets:  Communication Skills Desire for Improvement Housing Social Support  ADL's:  Intact  Cognition: WNL  Sleep:  Fair   Screenings: GAD-7    Flowsheet Row Video Visit from 02/18/2021 in [facility] Video Visit from 11/10/2020 in [facility] Video Visit from 09/03/2020 in [facility] Video Visit from 06/10/2020 in [facility]  Total GAD-7 Score 7 7 2 14       PHQ2-9    Flowsheet Row Video Visit from 02/18/2021 in [facility] Video Visit from 11/10/2020 in [facility] Video Visit from 09/03/2020 in [facility] Video Visit from 07/16/2020 in [facility] Video Visit from 06/10/2020 in [facility]  PHQ-2 Total Score 0 1 2 1 3   PHQ-9 Total Score -- 6 -- 7 12      Flowsheet Row Video Visit from 06/10/2020 in [facility]  C-SSRS RISK CATEGORY No Risk        Assessment and Plan: Jose Maddox is a 50 year old Caucasian male, employed, lives in [place], married, has a history of gastroesophageal reflux disease, hyperlipidemia, anxiety disorder, hair loss, erectile dysfunction was evaluated by telemedicine today.  Patient with adverse side effects to the sertraline, will benefit from the following plan.  Plan GAD-unstable Taper of sertraline.  Patient currently takes one third of a tablet of the 100 mg.  Patient advised to take it every other day for 5 doses and stop taking it. Start Cymbalta 30 mg p.o. daily Provided medication education. Continue psychotherapy sessions with [facility] at [place], extension [phone]  Insomnia-stable Ambien 5 mg p.o. nightly as needed  Follow-up in clinic in 4 weeks or sooner if needed.  This note was generated in part or whole with voice recognition software. Voice recognition is usually quite accurate but there are transcription errors that can and very often do occur. I apologize for any typographical errors that were not detected and corrected.        Mike Craze, MD 02/18/2021, 12:10 PM

## 2021-02-18 NOTE — Patient Instructions (Signed)
Duloxetine Delayed-Release Capsules What is this medication? DULOXETINE (doo LOX e teen) treats depression, anxiety, fibromyalgia, and certain types of chronic pain such as nerve, bone, or joint pain. It increases the amount of serotonin and norepinephrine in the brain, hormones that help regulate mood and pain. It belongs to a group of medications called SNRIs. This medicine may be used for other purposes; ask your health care provider or pharmacist if you have questions. COMMON BRAND NAME(S): Cymbalta, Drizalma, Irenka What should I tell my care team before I take this medication? They need to know if you have any of these conditions: Bipolar disorder Glaucoma High blood pressure Kidney disease Liver disease Seizures Suicidal thoughts, plans or attempt; a previous suicide attempt by you or a family member Take medications that treat or prevent blood clots Taken medications called MAOIs like Carbex, Eldepryl, Marplan, Nardil, and Parnate within 14 days Trouble passing urine An unusual reaction to duloxetine, other medications, foods, dyes, or preservatives Pregnant or trying to get pregnant Breast-feeding How should I use this medication? Take this medication by mouth with a glass of water. Follow the directions on the prescription label. Do not crush, cut or chew some capsules of this medication. Some capsules may be opened and sprinkled on applesauce. Check with your care team or pharmacist if you are not sure. You can take this medication with or without food. Take your medication at regular intervals. Do not take your medication more often than directed. Do not stop taking this medication suddenly except upon the advice of your care team. Stopping this medication too quickly may cause serious side effects or your condition may worsen. A special MedGuide will be given to you by the pharmacist with each prescription and refill. Be sure to read this information carefully each time. Talk to  your care team regarding the use of this medication in children. While this medication may be prescribed for children as young as 7 years of age for selected conditions, precautions do apply. Overdosage: If you think you have taken too much of this medicine contact a poison control center or emergency room at once. NOTE: This medicine is only for you. Do not share this medicine with others. What if I miss a dose? If you miss a dose, take it as soon as you can. If it is almost time for your next dose, take only that dose. Do not take double or extra doses. What may interact with this medication? Do not take this medication with any of the following: Desvenlafaxine Levomilnacipran Linezolid MAOIs like Carbex, Eldepryl, Emsam, Marplan, Nardil, and Parnate Methylene blue (injected into a vein) Milnacipran Safinamide Thioridazine Venlafaxine Viloxazine This medication may also interact with the following: Alcohol Amphetamines Aspirin and aspirin-like medications Certain antibiotics like ciprofloxacin and enoxacin Certain medications for blood pressure, heart disease, irregular heart beat Certain medications for depression, anxiety, or psychotic disturbances Certain medications for migraine headache like almotriptan, eletriptan, frovatriptan, naratriptan, rizatriptan, sumatriptan, zolmitriptan Certain medications that treat or prevent blood clots like warfarin, enoxaparin, and dalteparin Cimetidine Fentanyl Lithium NSAIDS, medications for pain and inflammation, like ibuprofen or naproxen Phentermine Procarbazine Rasagiline Sibutramine St. John's wort Theophylline Tramadol Tryptophan This list may not describe all possible interactions. Give your health care provider a list of all the medicines, herbs, non-prescription drugs, or dietary supplements you use. Also tell them if you smoke, drink alcohol, or use illegal drugs. Some items may interact with your medicine. What should I watch  for while using this medication? Tell your   care team if your symptoms do not get better or if they get worse. Visit your care team for regular checks on your progress. Because it may take several weeks to see the full effects of this medication, it is important to continue your treatment as prescribed by your care team. This medication may cause serious skin reactions. They can happen weeks to months after starting the medication. Contact your care team right away if you notice fevers or flu-like symptoms with a rash. The rash may be red or purple and then turn into blisters or peeling of the skin. Or, you might notice a red rash with swelling of the face, lips, or lymph nodes in your neck or under your arms. Watch for new or worsening thoughts of suicide or depression. This includes sudden changes in mood, behaviors, or thoughts. These changes can happen at any time but are more common in the beginning of treatment or after a change in dose. Call your care team right away if you experience these thoughts or worsening depression. Manic episodes may happen in patients with bipolar disorder who take this medication. Watch for changes in feelings or behaviors such as feeling anxious, nervous, agitated, panicky, irritable, hostile, aggressive, impulsive, severely restless, overly excited and hyperactive, or trouble sleeping. These symptoms can happen at any time, but are more common in the beginning of treatment or after a change in dose. Call your care team right away if you notice any of these symptoms. You may get drowsy or dizzy. Do not drive, use machinery, or do anything that needs mental alertness until you know how this medication affects you. Do not stand or sit up quickly, especially if you are an older patient. This reduces the risk of dizzy or fainting spells. Alcohol may interfere with the effect of this medication. Avoid alcoholic drinks. This medication may increase blood sugar. The risk may be  higher in patients who already have diabetes. Ask your care team what you can do to lower your risk of diabetes while taking this medication. This medication can cause an increase in blood pressure. This medication can also cause a sudden drop in your blood pressure, which may make you feel faint and increase the chance of a fall. These effects are most common when you first start the medication or when the dose is increased, or during use of other medications that can cause a sudden drop in blood pressure. Check with your care team for instructions on monitoring your blood pressure while taking this medication. Your mouth may get dry. Chewing sugarless gum or sucking hard candy, and drinking plenty of water, may help. Contact your care team if the problem does not go away or is severe. What side effects may I notice from receiving this medication? Side effects that you should report to your care team as soon as possible: Allergic reactions--skin rash, itching, hives, swelling of the face, lips, tongue, or throat Bleeding--bloody or black, tar-like stools, red or dark brown urine, vomiting blood or brown material that looks like coffee grounds, small, red or purple spots on skin, unusual bleeding or bruising Increase in blood pressure Liver injury--right upper belly pain, loss of appetite, nausea, light-colored stool, dark yellow or brown urine, yellowing skin or eyes, unusual weakness or fatigue Low sodium level--muscle weakness, fatigue, dizziness, headache, confusion Redness, blistering, peeling, or loosening of the skin, including inside the mouth Serotonin syndrome--irritability, confusion, fast or irregular heartbeat, muscle stiffness, twitching muscles, sweating, high fever, seizures, chills, vomiting, diarrhea   Sudden eye pain or change in vision such as blurry vision, seeing halos around lights, vision loss Thoughts of suicide or self-harm, worsening mood, feelings of depression Trouble passing  urine Side effects that usually do not require medical attention (report to your care team if they continue or are bothersome): Change in sex drive or performance Constipation Diarrhea Dizziness Dry mouth Excessive sweating Loss of appetite Nausea Vomiting This list may not describe all possible side effects. Call your doctor for medical advice about side effects. You may report side effects to FDA at 1-800-FDA-1088. Where should I keep my medication? Keep out of the reach of children and pets. Store at room temperature between 15 and 30 degrees C (59 to 86 degrees F). Get rid of any unused medication after the expiration date. To get rid of medications that are no longer needed or have expired: Take the medication to a medication take-back program. Check with your pharmacy or law enforcement to find a location. If you cannot return the medication, check the label or package insert to see if the medication should be thrown out in the garbage or flushed down the toilet. If you are not sure, ask your care team. If it is safe to put it in the trash, take the medication out of the container. Mix the medication with cat litter, dirt, coffee grounds, or other unwanted substance. Seal the mixture in a bag or container. Put it in the trash. NOTE: This sheet is a summary. It may not cover all possible information. If you have questions about this medicine, talk to your doctor, pharmacist, or health care provider.  2022 Elsevier/Gold Standard (2020-03-16 00:00:00)  

## 2021-03-16 ENCOUNTER — Other Ambulatory Visit: Payer: Self-pay | Admitting: Psychiatry

## 2021-03-16 DIAGNOSIS — F411 Generalized anxiety disorder: Secondary | ICD-10-CM

## 2021-03-25 ENCOUNTER — Telehealth (INDEPENDENT_AMBULATORY_CARE_PROVIDER_SITE_OTHER): Payer: BC Managed Care – PPO | Admitting: Psychiatry

## 2021-03-25 ENCOUNTER — Encounter: Payer: Self-pay | Admitting: Psychiatry

## 2021-03-25 ENCOUNTER — Other Ambulatory Visit: Payer: Self-pay

## 2021-03-25 ENCOUNTER — Other Ambulatory Visit: Payer: Self-pay | Admitting: Psychiatry

## 2021-03-25 DIAGNOSIS — G4701 Insomnia due to medical condition: Secondary | ICD-10-CM

## 2021-03-25 DIAGNOSIS — F411 Generalized anxiety disorder: Secondary | ICD-10-CM | POA: Diagnosis not present

## 2021-03-25 DIAGNOSIS — Z111 Encounter for screening for respiratory tuberculosis: Secondary | ICD-10-CM | POA: Diagnosis not present

## 2021-03-25 DIAGNOSIS — R5383 Other fatigue: Secondary | ICD-10-CM | POA: Diagnosis not present

## 2021-03-25 DIAGNOSIS — M130 Polyarthritis, unspecified: Secondary | ICD-10-CM | POA: Diagnosis not present

## 2021-03-25 MED ORDER — DULOXETINE HCL 60 MG PO CPEP: 60.0000 mg | ORAL_CAPSULE | Freq: Every day | ORAL | 0 refills | Status: DC

## 2021-03-25 MED ORDER — DULOXETINE HCL 30 MG PO CPEP: 30.0000 mg | ORAL_CAPSULE | Freq: Two times a day (BID) | ORAL | 0 refills | Status: DC

## 2021-03-25 NOTE — Progress Notes (Signed)
Virtual Visit via Video Note  I connected with Jose MangesBryan Corning on 03/25/21 at 11:40 AM EST by a video enabled telemedicine application and verified that I am speaking with the correct person using two identifiers.  Location Provider Location : [facility] Patient Location : Home  Participants: Patient , Provider   I discussed the limitations of evaluation and management by telemedicine and the availability of in person appointments. The patient expressed understanding and agreed to proceed.   I discussed the assessment and treatment plan with the patient. The patient was provided an opportunity to ask questions and all were answered. The patient agreed with the plan and demonstrated an understanding of the instructions.   The patient was advised to call back or seek an in-person evaluation if the symptoms worsen or if the condition fails to improve as anticipated.    [facility] MD OP Progress Note  03/25/2021 2:16 PM Jose MangesBryan Sahakian  MRN:  [id number]  Chief Complaint:  Chief Complaint   Follow-up; Anxiety    HPI: Jose Maddox is a 51 year old Caucasian male, employed, married, lives in [place], has a history of GAD, insomnia, gastroesophageal reflux disease, hyperlipidemia, erectile disorder, nonscarring hair loss, was evaluated by telemedicine today.  Patient today reports he had a good holiday season.  Due to the holiday schedule sleep pattern has changed however he is currently working on the same.  Denies any significant depression.  Continues to have anxiety, worry, feeling nervous on and off.  Cymbalta likely beneficial however would like to give it more time and is agreeable to increasing the dosage.  Does report ' zoning out ' episodes however they are manageable.  Agrees to Pharmacist, hospitallet writer know if it gets worse.  Also reports mild GI problems like bloating and constipation.  They are also currently manageable.  Sexual side effects likely similar to the Zoloft.  Patient denies any  suicidality, homicidality or perceptual disturbances.  Currently following up with the rheumatologist, reports further work-up is being done.  Continues to have myalgia, body ache, not so pronounced since he had a break from work.  Reports he did have a few more drinks during the holiday season however is currently cutting back.  Denies any other concerns today.  Visit Diagnosis:    ICD-10-CM   1. GAD (generalized anxiety disorder)  F41.1 DULoxetine (CYMBALTA) 30 MG capsule    2. Insomnia due to medical condition  G47.01    anxiety, pain      Past Psychiatric History: Reviewed past psychiatric history from progress note on 06/10/2020  Past Medical History:  Past Medical History:  Diagnosis Date   Anxiety    GERD (gastroesophageal reflux disease)    Hyperlipidemia    IBS (irritable bowel syndrome)     Past Surgical History:  Procedure Laterality Date   FORAMINOTOMY 1 LEVEL     C7   WRIST SURGERY Left 2004    Family Psychiatric History: Reviewed family psychiatric history from progress note on 06/10/2020  Family History:  Family History  Problem Relation Age of Onset   Uterine cancer Other        Grandmother   Arthritis Father    Hyperlipidemia Father    Alcohol abuse Father    Alzheimer's disease Paternal Grandmother    Alcoholism Paternal Grandfather    Cirrhosis Paternal Grandfather    Alcohol abuse Paternal Grandfather    Drug abuse Paternal Uncle    Drug abuse Paternal Uncle     Social History: Reviewed social history from progress  note on 06/10/2020 Social History   Socioeconomic History   Marital status: Married    Spouse name: Not on file   Number of children: 2   Years of education: Not on file   Highest education level: Not on file  Occupational History   Not on file  Tobacco Use   Smoking status: Former   Smokeless tobacco: Former    Types: Snuff    Quit date: 05/13/2017  Substance and Sexual Activity   Alcohol use: Yes    Alcohol/week: 6.0  standard drinks    Types: 6 Cans of beer per week   Drug use: Not on file    Comment: twice a year   Sexual activity: Yes  Other Topics Concern   Not on file  Social History Narrative   Not on file   Social Determinants of Health   Financial Resource Strain: Not on file  Food Insecurity: Not on file  Transportation Needs: Not on file  Physical Activity: Not on file  Stress: Not on file  Social Connections: Not on file    Allergies: No Known Allergies  Metabolic Disorder Labs: No results found for: HGBA1C, MPG No results found for: PROLACTIN No results found for: CHOL, TRIG, HDL, CHOLHDL, VLDL, LDLCALC No results found for: TSH  Therapeutic Level Labs: No results found for: LITHIUM No results found for: VALPROATE No components found for:  CBMZ  Current Medications: Current Outpatient Medications  Medication Sig Dispense Refill   DULoxetine (CYMBALTA) 30 MG capsule Take 1 capsule (30 mg total) by mouth 2 (two) times daily. Dose increase 180 capsule 0   finasteride (PROPECIA) 1 MG tablet Take 1 mg by mouth daily.     finasteride (PROSCAR) 5 MG tablet Take 5 mg by mouth daily.     ibuprofen (ADVIL,MOTRIN) 200 MG tablet Take 200 mg by mouth every 6 (six) hours as needed.     LAGEVRIO 200 MG CAPS SMARTSIG:4 Capsule(s) By Mouth Every 12 Hours     meloxicam (MOBIC) 15 MG tablet Take 1 tablet by mouth daily.     methocarbamol (ROBAXIN) 500 MG tablet Take by mouth.     Misc. Devices MISC Compounding pharmacy cream - gaba 5%, keta 5%, keto 5%, lido 5%, amit 2%, carb cream  Apply daily prn pain. Dispense 4 ounces. 5 refills.     pantoprazole (PROTONIX) 40 MG tablet Take 40 mg by mouth daily.     tadalafil (CIALIS) 10 MG tablet Take 10 mg by mouth as needed.     zolpidem (AMBIEN) 5 MG tablet Take 0.5-1 tablets (2.5-5 mg total) by mouth at bedtime as needed for sleep. 30 tablet 1   No current facility-administered medications for this visit.     Musculoskeletal: Strength &  Muscle Tone: within normal limits Gait & Station: normal Patient leans: N/A  Psychiatric Specialty Exam: Review of Systems  Gastrointestinal:  Positive for constipation.  Musculoskeletal:  Positive for arthralgias and myalgias.  Psychiatric/Behavioral:  The patient is nervous/anxious.   All other systems reviewed and are negative.  There were no vitals taken for this visit.There is no height or weight on file to calculate BMI.  General Appearance: Casual  Eye Contact:  Good  Speech:  Normal Rate  Volume:  Normal  Mood:  Anxious  Affect:  Congruent  Thought Process:  Goal Directed and Descriptions of Associations: Intact  Orientation:  Full (Time, Place, and Person)  Thought Content: Logical   Suicidal Thoughts:  No  Homicidal Thoughts:  No  Memory:  Immediate;   Fair Recent;   Fair Remote;   Fair  Judgement:  Fair  Insight:  Fair  Psychomotor Activity:  Normal  Concentration:  Concentration: Fair and Attention Span: Fair  Recall:  [employer] of Knowledge: Fair  Language: Fair  Akathisia:  No  Handed:  Right  AIMS (if indicated): not done  Assets:  Communication Skills Desire for Improvement Housing Resilience Social Support Talents/Skills Transportation Vocational/Educational  ADL's:  Intact  Cognition: WNL  Sleep:   improving   Screenings: AUDIT    Flowsheet Row Video Visit from 03/25/2021 in [facility]  Alcohol Use Disorder Identification Test Final Score (AUDIT) 5      GAD-7    Flowsheet Row Video Visit from 03/25/2021 in [facility] Video Visit from 02/18/2021 in [facility] Video Visit from 11/10/2020 in [facility] Video Visit from 09/03/2020 in [facility] Video Visit from 06/10/2020 in [facility]  Total GAD-7 Score 6 7 7 2 14       PHQ2-9    Flowsheet Row Video Visit from 03/25/2021  in [facility] Video Visit from 02/18/2021 in [facility] Video Visit from 11/10/2020 in [facility] Video Visit from 09/03/2020 in [facility] Video Visit from 07/16/2020 in [facility]  PHQ-2 Total Score 0 0 1 2 1   PHQ-9 Total Score 3 -- 6 -- 7      Flowsheet Row Video Visit from 06/10/2020 in [facility]  C-SSRS RISK CATEGORY No Risk        Assessment and Plan: Yerik Zeringue is a 51 year old Caucasian male, employed, lives in [place], married, has a history of GAD, insomnia, GERD, hyperlipidemia, hair loss, erectile dysfunction was evaluated by telemedicine today.  Patient continues to have anxiety, possible side effects to Cymbalta although manageable.  Agreeable to increasing the dosage and monitoring.  Plan as noted below.  Plan GAD-unstable Increase Cymbalta to 30 mg p.o. twice daily. If GI symptoms are significant, could continue 30 mg for another week.  Monitor for side effects and if any side effects worsen we will consider reducing the dosage or changing to another antidepressant. Provided medication education. Continue psychotherapy sessions with Ms.44  Insomnia-improving Continued sleep hygiene techniques Ambien 5 mg p.o. nightly as needed  Follow-up in clinic in 6 to 8 weeks or sooner if needed.  This note was generated in part or whole with voice recognition software. Voice recognition is usually quite accurate but there are transcription errors that can and very often do occur. I apologize for any typographical errors that were not detected and corrected.    [place], MD 03/25/2021, 2:16 PM

## 2021-03-25 NOTE — Telephone Encounter (Signed)
I have sent Cymbalta 60 mg daily to pharmacy since 30 mg twice daily is not approved by insurance.

## 2021-04-22 DIAGNOSIS — M533 Sacrococcygeal disorders, not elsewhere classified: Secondary | ICD-10-CM | POA: Diagnosis not present

## 2021-04-22 DIAGNOSIS — M130 Polyarthritis, unspecified: Secondary | ICD-10-CM | POA: Diagnosis not present

## 2021-04-25 ENCOUNTER — Other Ambulatory Visit: Payer: Self-pay | Admitting: Internal Medicine

## 2021-04-25 DIAGNOSIS — M461 Sacroiliitis, not elsewhere classified: Secondary | ICD-10-CM

## 2021-06-09 ENCOUNTER — Other Ambulatory Visit: Payer: Self-pay | Admitting: Psychiatry

## 2021-06-09 DIAGNOSIS — F411 Generalized anxiety disorder: Secondary | ICD-10-CM

## 2021-07-01 ENCOUNTER — Other Ambulatory Visit: Payer: Self-pay | Admitting: Psychiatry

## 2021-07-01 DIAGNOSIS — F411 Generalized anxiety disorder: Secondary | ICD-10-CM

## 2021-07-22 DIAGNOSIS — M50122 Cervical disc disorder at C5-C6 level with radiculopathy: Secondary | ICD-10-CM | POA: Diagnosis not present

## 2021-07-22 DIAGNOSIS — M9902 Segmental and somatic dysfunction of thoracic region: Secondary | ICD-10-CM | POA: Diagnosis not present

## 2021-07-22 DIAGNOSIS — M9901 Segmental and somatic dysfunction of cervical region: Secondary | ICD-10-CM | POA: Diagnosis not present

## 2021-07-22 DIAGNOSIS — M7711 Lateral epicondylitis, right elbow: Secondary | ICD-10-CM | POA: Diagnosis not present

## 2021-07-22 DIAGNOSIS — M5382 Other specified dorsopathies, cervical region: Secondary | ICD-10-CM | POA: Diagnosis not present

## 2021-07-29 ENCOUNTER — Encounter: Payer: Self-pay | Admitting: Psychiatry

## 2021-07-29 ENCOUNTER — Telehealth (INDEPENDENT_AMBULATORY_CARE_PROVIDER_SITE_OTHER): Payer: BC Managed Care – PPO | Admitting: Psychiatry

## 2021-07-29 DIAGNOSIS — G4701 Insomnia due to medical condition: Secondary | ICD-10-CM | POA: Diagnosis not present

## 2021-07-29 DIAGNOSIS — M9902 Segmental and somatic dysfunction of thoracic region: Secondary | ICD-10-CM | POA: Diagnosis not present

## 2021-07-29 DIAGNOSIS — M9901 Segmental and somatic dysfunction of cervical region: Secondary | ICD-10-CM | POA: Diagnosis not present

## 2021-07-29 DIAGNOSIS — F411 Generalized anxiety disorder: Secondary | ICD-10-CM | POA: Diagnosis not present

## 2021-07-29 DIAGNOSIS — M9907 Segmental and somatic dysfunction of upper extremity: Secondary | ICD-10-CM | POA: Diagnosis not present

## 2021-07-29 DIAGNOSIS — M50122 Cervical disc disorder at C5-C6 level with radiculopathy: Secondary | ICD-10-CM | POA: Diagnosis not present

## 2021-07-29 DIAGNOSIS — M5382 Other specified dorsopathies, cervical region: Secondary | ICD-10-CM | POA: Diagnosis not present

## 2021-07-29 MED ORDER — AMITRIPTYLINE HCL 10 MG PO TABS: 10.0000 mg | ORAL_TABLET | Freq: Every day | ORAL | 1 refills | Status: DC

## 2021-07-29 NOTE — Patient Instructions (Signed)
Amitriptyline Tablets What is this medication? AMITRIPTYLINE (a mee TRIP ti leen) treats depression. It increases the amount of serotonin and norepinephrine in the brain, hormones that help regulate mood. It belongs to a group of medications called tricyclic antidepressants (TCAs). This medicine may be used for other purposes; ask your health care provider or pharmacist if you have questions. COMMON BRAND NAME(S): Elavil, Vanatrip What should I tell my care team before I take this medication? They need to know if you have any of these conditions: An alcohol problem Asthma, trouble breathing Bipolar disorder or schizophrenia Difficulty passing urine, prostate trouble Glaucoma Heart disease or previous heart attack Liver disease Over active thyroid Seizures Thoughts or plans of suicide, a previous suicide attempt, or family history of suicide attempt An unusual or allergic reaction to amitriptyline, other medications, foods, dyes, or preservatives Pregnant or trying to get pregnant Breast-feeding How should I use this medication? Take this medication by mouth with a drink of water. Follow the directions on the prescription label. You can take the tablets with or without food. Take your medication at regular intervals. Do not take it more often than directed. Do not stop taking this medication suddenly except upon the advice of your care team. Stopping this medication too quickly may cause serious side effects or your condition may worsen. A special MedGuide will be given to you by the pharmacist with each prescription and refill. Be sure to read this information carefully each time. Talk to your care team regarding the use of this medication in children. Special care may be needed. Overdosage: If you think you have taken too much of this medicine contact a poison control center or emergency room at once. NOTE: This medicine is only for you. Do not share this medicine with others. What if I  miss a dose? If you miss a dose, take it as soon as you can. If it is almost time for your next dose, take only that dose. Do not take double or extra doses. What may interact with this medication? Do not take this medication with any of the following: Arsenic trioxide Certain medications used to regulate abnormal heartbeat or to treat other heart conditions Cisapride Droperidol Halofantrine Linezolid MAOIs like Carbex, Eldepryl, Marplan, Nardil, and Parnate Methylene blue Other medications for mental depression Phenothiazines like perphenazine, thioridazine and chlorpromazine Pimozide Probucol Procarbazine Sparfloxacin St. John's Wort This medication may also interact with the following: Atropine and related medications like hyoscyamine, scopolamine, tolterodine and others Barbiturate medications for inducing sleep or treating seizures, like phenobarbital Cimetidine Disulfiram Ethchlorvynol Thyroid hormones such as levothyroxine Ziprasidone This list may not describe all possible interactions. Give your health care provider a list of all the medicines, herbs, non-prescription drugs, or dietary supplements you use. Also tell them if you smoke, drink alcohol, or use illegal drugs. Some items may interact with your medicine. What should I watch for while using this medication? Tell your care team if your symptoms do not get better or if they get worse. Visit your care team for regular checks on your progress. Because it may take several weeks to see the full effects of this medication, it is important to continue your treatment as prescribed by your care team. Patients and their families should watch out for new or worsening thoughts of suicide or depression. Also watch out for sudden changes in feelings such as feeling anxious, agitated, panicky, irritable, hostile, aggressive, impulsive, severely restless, overly excited and hyperactive, or not being able to sleep. If   this happens,  especially at the beginning of treatment or after a change in dose, call your care team. You may get drowsy or dizzy. Do not drive, use machinery, or do anything that needs mental alertness until you know how this medication affects you. Do not stand or sit up quickly, especially if you are an older patient. This reduces the risk of dizzy or fainting spells. Alcohol may interfere with the effect of this medication. Avoid alcoholic drinks. Do not treat yourself for coughs, colds, or allergies without asking your care team for advice. Some ingredients can increase possible side effects. Your mouth may get dry. Chewing sugarless gum or sucking hard candy, and drinking plenty of water will help. Contact your care team if the problem does not go away or is severe. This medication may cause dry eyes and blurred vision. If you wear contact lenses you may feel some discomfort. Lubricating drops may help. See your eye doctor if the problem does not go away or is severe. This medication can cause constipation. Try to have a bowel movement at least every 2 to 3 days. If you do not have a bowel movement for 3 days, call your care team. This medication can make you more sensitive to the sun. Keep out of the sun. If you cannot avoid being in the sun, wear protective clothing and use sunscreen. Do not use sun lamps or tanning beds/booths. What side effects may I notice from receiving this medication? Side effects that you should report to your care team as soon as possible: Allergic reactions--skin rash, itching, hives, swelling of the face, lips, tongue, or throat Heart rhythm changes-- fast or irregular heartbeat, dizziness, feeling faint or lightheaded, chest pain, trouble breathing Serotonin syndrome--irritability, confusion, fast or irregular heartbeat, muscle stiffness, twitching muscles, sweating, high fever, seizure, chills, vomiting, diarrhea Sudden eye pain or change in vision such as blurry vision, seeing  halos around lights, vision loss Thoughts of suicide or self-harm, worsening mood, feelings of depression Side effects that usually do not require medical attention (report to your care team if they continue or are bothersome): Change in appetite or weight Change in sex drive or performance Constipation Dizziness Drowsiness Dry mouth Tremors This list may not describe all possible side effects. Call your doctor for medical advice about side effects. You may report side effects to FDA at 1-800-FDA-1088. Where should I keep my medication? Keep out of the reach of children and pets. Store at room temperature between 20 and 25 degrees C (68 and 77 degrees F). Throw away any unused medication after the expiration date. NOTE: This sheet is a summary. It may not cover all possible information. If you have questions about this medicine, talk to your doctor, pharmacist, or health care provider.  2023 Elsevier/Gold Standard (2020-04-02 00:00:00)  

## 2021-07-29 NOTE — Progress Notes (Signed)
Virtual Visit via Video Note ? ?I connected with Jose Maddox on 07/29/21 at 10:00 AM EDT by a video enabled telemedicine application and verified that I am speaking with the correct person using two identifiers. ? ?Location ?Provider Location : [facility] ?Patient Location : Home ? ?Participants: Patient , Provider ?  ?I discussed the limitations of evaluation and management by telemedicine and the availability of in person appointments. The patient expressed understanding and agreed to proceed. ? ?  ?I discussed the assessment and treatment plan with the patient. The patient was provided an opportunity to ask questions and all were answered. The patient agreed with the plan and demonstrated an understanding of the instructions. ?  ?The patient was advised to call back or seek an in-person evaluation if the symptoms worsen or if the condition fails to improve as anticipated. ? ?[place] MD OP Progress Note ? ?07/29/2021 1:11 PM ?Jose Maddox  ?MRN:  [id number] ? ?Chief Complaint:  ?Chief Complaint  ?Patient presents with  ? Follow-up: 51 year old Caucasian male, with history of anxiety, insomnia, gastroesophageal reflux disease, chronic pain, presented for medication management.  ? ?HPI: Jose Maddox is a 51 year old Caucasian male, employed, married, lives in [place], has a history of GAD, insomnia, gastroesophageal reflux disease, hyperlipidemia, erectile disorder, nonscarring hair loss was evaluated by telemedicine today. ? ?Patient continues to struggle with pain of his shoulder as well as neck radiating down his arm.  Likely mechanical given his occupation.  Patient reports rheumatologist cleared him since they could not find anything.  He has been taking ibuprofen as needed to help with the pain.  He also has Robaxin available as needed.  Currently is under the care of a chiropractor.  The pain does have an impact on his sleep as well as his mood.  He does feel irritable often due to the pain.  It is hard to find  a comfortable position to sleep.  Sleep hence has been restless.  He no longer takes the zolpidem. ? ?Patient denies any significant depression or anxiety.  The Cymbalta does help with that. ? ?Reports appetite as fair. ? ?Quit drinking alcohol a month ago.  Wants to stay away from it. ? ?Denies suicidality, homicidality or perceptual disturbances. ? ?Denies any other concerns today. ? ?Visit Diagnosis:  ?  ICD-10-CM   ?1. GAD (generalized anxiety disorder)  F41.1   ?  ?2. Insomnia due to medical condition  G47.01 amitriptyline (ELAVIL) 10 MG tablet  ? pain  ?  ? ? ?Past Psychiatric History: Reviewed past psychiatric history from progress note on 06/10/2020. ? ?Past Medical History:  ?Past Medical History:  ?Diagnosis Date  ? Anxiety   ? GERD (gastroesophageal reflux disease)   ? Hyperlipidemia   ? IBS (irritable bowel syndrome)   ?  ?Past Surgical History:  ?Procedure Laterality Date  ? FORAMINOTOMY 1 LEVEL    ? C7  ? WRIST SURGERY Left 2004  ? ? ?Family Psychiatric History: Reviewed family psychiatric history from progress note on 06/10/2020. ? ?Family History:  ?Family History  ?Problem Relation Age of Onset  ? Uterine cancer Other   ?     Grandmother  ? Arthritis Father   ? Hyperlipidemia Father   ? Alcohol abuse Father   ? Alzheimer's disease Paternal Grandmother   ? Alcoholism Paternal Grandfather   ? Cirrhosis Paternal Grandfather   ? Alcohol abuse Paternal Grandfather   ? Drug abuse Paternal Uncle   ? Drug abuse Paternal Uncle   ? ? ?Social  History: Reviewed social history from progress note on 06/10/2020. ?Social History  ? ?Socioeconomic History  ? Marital status: Married  ?  Spouse name: Not on file  ? Number of children: 2  ? Years of education: Not on file  ? Highest education level: Not on file  ?Occupational History  ? Not on file  ?Tobacco Use  ? Smoking status: Former  ? Smokeless tobacco: Former  ?  Types: Snuff  ?  Quit date: 05/13/2017  ?Substance and Sexual Activity  ? Alcohol use: Yes  ?   Alcohol/week: 6.0 standard drinks  ?  Types: 6 Cans of beer per week  ? Drug use: Not on file  ?  Comment: twice a year  ? Sexual activity: Yes  ?Other Topics Concern  ? Not on file  ?Social History Narrative  ? Not on file  ? ?Social Determinants of Health  ? ?Financial Resource Strain: Not on file  ?Food Insecurity: Not on file  ?Transportation Needs: Not on file  ?Physical Activity: Not on file  ?Stress: Not on file  ?Social Connections: Not on file  ? ? ?Allergies: No Known Allergies ? ?Metabolic Disorder Labs: ?No results found for: HGBA1C, MPG ?No results found for: PROLACTIN ?No results found for: CHOL, TRIG, HDL, CHOLHDL, VLDL, LDLCALC ?No results found for: TSH ? ?Therapeutic Level Labs: ?No results found for: LITHIUM ?No results found for: VALPROATE ?No components found for:  CBMZ ? ?Current Medications: ?Current Outpatient Medications  ?Medication Sig Dispense Refill  ? amitriptyline (ELAVIL) 10 MG tablet Take 1 tablet (10 mg total) by mouth at bedtime. For sleep, pain 30 tablet 1  ? DULoxetine (CYMBALTA) 60 MG capsule TAKE 1 CAPSULE BY MOUTH EVERY DAY 90 capsule 0  ? finasteride (PROPECIA) 1 MG tablet Take 1 mg by mouth daily.    ? ibuprofen (ADVIL,MOTRIN) 200 MG tablet Take 200 mg by mouth every 6 (six) hours as needed.    ? Misc. Devices MISC Compounding pharmacy cream - gaba 5%, keta 5%, keto 5%, lido 5%, amit 2%, carb cream ? ?Apply daily prn pain. Dispense 4 ounces. 5 refills.    ? pantoprazole (PROTONIX) 40 MG tablet Take 40 mg by mouth daily.    ? propranolol (INDERAL) 10 MG tablet SMARTSIG:1-2 Tablet(s) By Mouth PRN    ? tadalafil (CIALIS) 10 MG tablet Take 10 mg by mouth as needed.    ? finasteride (PROSCAR) 5 MG tablet Take 5 mg by mouth daily. (Patient not taking: Reported on 07/29/2021)    ? LAGEVRIO 200 MG CAPS SMARTSIG:4 Capsule(s) By Mouth Every 12 Hours (Patient not taking: Reported on 07/29/2021)    ? meloxicam (MOBIC) 15 MG tablet Take 1 tablet by mouth daily. (Patient not taking:  Reported on 07/29/2021)    ? methocarbamol (ROBAXIN) 500 MG tablet Take by mouth. (Patient not taking: Reported on 07/29/2021)    ? zolpidem (AMBIEN) 5 MG tablet Take 0.5-1 tablets (2.5-5 mg total) by mouth at bedtime as needed for sleep. (Patient not taking: Reported on 07/29/2021) 30 tablet 1  ? ?No current facility-administered medications for this visit.  ? ? ? ?Musculoskeletal: ?Strength & Muscle Tone:  UTA ?Gait & Station:  Seated ?Patient leans: N/A ? ?Psychiatric Specialty Exam: ?Review of Systems  ?Musculoskeletal:   ?     Shoulder pain , neck pain - chronic  ?Psychiatric/Behavioral:  Positive for sleep disturbance.   ?     Irritability  ?All other systems reviewed and are negative.  ?There were  no vitals taken for this visit.There is no height or weight on file to calculate BMI.  ?General Appearance: Casual  ?Eye Contact:  Good  ?Speech:  Normal Rate  ?Volume:  Normal  ?Mood:   irritable  ?Affect:  Congruent  ?Thought Process:  Goal Directed and Descriptions of Associations: Intact  ?Orientation:  Full (Time, Place, and Person)  ?Thought Content: Logical   ?Suicidal Thoughts:  No  ?Homicidal Thoughts:  No  ?Memory:  Immediate;   Fair ?Recent;   Fair ?Remote;   Fair  ?Judgement:  Fair  ?Insight:  Fair  ?Psychomotor Activity:  Normal  ?Concentration:  Concentration: Fair and Attention Span: Fair  ?Recall:  Fair  ?Fund of Knowledge: Fair  ?Language: Fair  ?Akathisia:  No  ?Handed:  Right  ?AIMS (if indicated): not done  ?Assets:  Communication Skills ?Desire for Improvement ?Housing ?Social Support  ?ADL's:  Intact  ?Cognition: WNL  ?Sleep:   restless due to pain  ? ?Screenings: ?AUDIT   ? ?Flowsheet Row Video Visit from 03/25/2021 in [facility]  ?Alcohol Use Disorder Identification Test Final Score (AUDIT) 5  ? ?  ? ?GAD-7   ? ?Flowsheet Row Video Visit from 07/29/2021 in [facility] Video Visit from 03/25/2021 in [facility]  Video Visit from 02/18/2021 in [facility] Video Visit from 11/10/2020 in [facility] Video Visit from 09/03/2020 in [facility]  ?Tot

## 2021-08-05 DIAGNOSIS — M542 Cervicalgia: Secondary | ICD-10-CM | POA: Diagnosis not present

## 2021-08-10 ENCOUNTER — Other Ambulatory Visit: Payer: Self-pay | Admitting: Orthopedic Surgery

## 2021-08-10 DIAGNOSIS — M5412 Radiculopathy, cervical region: Secondary | ICD-10-CM

## 2021-08-19 ENCOUNTER — Other Ambulatory Visit: Payer: BC Managed Care – PPO

## 2021-08-26 DIAGNOSIS — F411 Generalized anxiety disorder: Secondary | ICD-10-CM | POA: Diagnosis not present

## 2021-08-29 ENCOUNTER — Ambulatory Visit
Admission: RE | Admit: 2021-08-29 | Discharge: 2021-08-29 | Disposition: A | Discharge status: Home / Self Care | Payer: BC Managed Care – PPO | Source: Ambulatory Visit | Attending: Orthopedic Surgery | Admitting: Orthopedic Surgery

## 2021-08-29 DIAGNOSIS — M47812 Spondylosis without myelopathy or radiculopathy, cervical region: Secondary | ICD-10-CM | POA: Diagnosis not present

## 2021-08-29 DIAGNOSIS — M4802 Spinal stenosis, cervical region: Secondary | ICD-10-CM | POA: Diagnosis not present

## 2021-08-29 DIAGNOSIS — M5412 Radiculopathy, cervical region: Secondary | ICD-10-CM

## 2021-08-29 MED ORDER — IOPAMIDOL (ISOVUE-M 300) INJECTION 61%
1.0000 mL | Freq: Once | INTRAMUSCULAR | Status: AC | PRN
  Administered 2021-08-29: 1 mL via EPIDURAL

## 2021-08-29 MED ORDER — TRIAMCINOLONE ACETONIDE 40 MG/ML IJ SUSP (RADIOLOGY)
60.0000 mg | Freq: Once | INTRAMUSCULAR | Status: AC
  Administered 2021-08-29: 60 mg via EPIDURAL

## 2021-08-29 NOTE — Discharge Instructions (Signed)

## 2021-09-09 DIAGNOSIS — F411 Generalized anxiety disorder: Secondary | ICD-10-CM | POA: Diagnosis not present

## 2021-09-12 ENCOUNTER — Other Ambulatory Visit: Payer: BC Managed Care – PPO

## 2021-09-30 ENCOUNTER — Encounter: Payer: Self-pay | Admitting: Psychiatry

## 2021-09-30 ENCOUNTER — Ambulatory Visit (INDEPENDENT_AMBULATORY_CARE_PROVIDER_SITE_OTHER): Payer: BC Managed Care – PPO | Admitting: Psychiatry

## 2021-09-30 VITALS — BP 114/74 | HR 86 | Temp 98.3°F | Ht 69.0 in | Wt 161.8 lb

## 2021-09-30 DIAGNOSIS — G4701 Insomnia due to medical condition: Secondary | ICD-10-CM

## 2021-09-30 DIAGNOSIS — F411 Generalized anxiety disorder: Secondary | ICD-10-CM

## 2021-09-30 DIAGNOSIS — R37 Sexual dysfunction, unspecified: Secondary | ICD-10-CM | POA: Diagnosis not present

## 2021-09-30 MED ORDER — DULOXETINE HCL 20 MG PO CPEP: 40.0000 mg | ORAL_CAPSULE | Freq: Every day | ORAL | 0 refills | Status: DC

## 2021-09-30 MED ORDER — BUPROPION HCL 75 MG PO TABS: 75.0000 mg | ORAL_TABLET | Freq: Every morning | ORAL | 0 refills | Status: DC

## 2021-09-30 NOTE — Progress Notes (Signed)
BH MD OP Progress Note  09/30/2021 12:50 PM Jose Maddox  MRN:  [id number]  Chief Complaint:  Chief Complaint  Patient presents with   Follow-up: 51 year old Caucasian male with history of anxiety, insomnia, gastroesophageal reflux disease, chronic pain, presented for medication management.   HPI: Jose Maddox is a 51 year old Caucasian male, employed, married, lives in [place], has a history of GAD, insomnia, gastroesophageal reflux disease, hyperlipidemia, erectile dysfunction, nonscarring hair loss was evaluated in office today.  Patient today reports his anxiety symptoms are more under control.  When he drives he does have mild anxiety, he had it this morning while he was driving to the appointment today.  Patient however reports it was minimal and he could manage it.  Reports currently does not feel depressed.  Sleep is overall okay.  Has not needed the Ambien as needed.  Patient reports he stopped taking the Elavil since he felt groggy the next day.  He feels much better without it.  Does have some problems with his speech, blocking, word finding difficulties at times with started since being on SSRIs like sertraline and continues to have it on the Cymbalta.  Agreeable to dosage reduction to see if this will help.  Also struggles with sexual dysfunction on the Cymbalta.  Discussed Wellbutrin, agreeable to trial.  Currently in psychotherapy sessions, reports therapy sessions are beneficial.  Does have pain mostly of the neck, radiating down his arm as well as his hip joint.  Currently following up with his providers for the same. Had a steroid injection and that has helped with the neck pain.  Reports he has not been using any alcohol at all.  Denies any suicidality, homicidality or perceptual disturbances.  Denies any other concerns today.  Visit Diagnosis:    ICD-10-CM   1. GAD (generalized anxiety disorder)  F41.1 DULoxetine (CYMBALTA) 20 MG capsule    2. Insomnia  due to medical condition  G47.01     3. Sexual dysfunction  R37 DULoxetine (CYMBALTA) 20 MG capsule    buPROPion (WELLBUTRIN) 75 MG tablet   likely due to antidepressant      Past Psychiatric History: Reviewed past psychiatric history from progress note on 06/10/2020.  Past Medical History:  Past Medical History:  Diagnosis Date   Anxiety    GERD (gastroesophageal reflux disease)    Hyperlipidemia    IBS (irritable bowel syndrome)     Past Surgical History:  Procedure Laterality Date   FORAMINOTOMY 1 LEVEL     C7   WRIST SURGERY Left 2004    Family Psychiatric History: Reviewed family psychiatric history from progress note on 06/10/2020.  Family History:  Family History  Problem Relation Age of Onset   Uterine cancer Other        Grandmother   Arthritis Father    Hyperlipidemia Father    Alcohol abuse Father    Alzheimer's disease Paternal Grandmother    Alcoholism Paternal Grandfather    Cirrhosis Paternal Grandfather    Alcohol abuse Paternal Grandfather    Drug abuse Paternal Uncle    Drug abuse Paternal Uncle     Social History: Reviewed social history from progress note on 06/10/2020. Social History   Socioeconomic History   Marital status: Married    Spouse name: Not on file   Number of children: 2   Years of education: Not on file   Highest education level: Not on file  Occupational History   Not on file  Tobacco Use   Smoking status:  Former   Smokeless tobacco: Former    Types: Snuff    Quit date: 05/13/2017  Substance and Sexual Activity   Alcohol use: Yes    Alcohol/week: 6.0 standard drinks of alcohol    Types: 6 Cans of beer per week   Drug use: Not on file    Comment: twice a year   Sexual activity: Yes  Other Topics Concern   Not on file  Social History Narrative   Not on file   Social Determinants of Health   Financial Resource Strain: Not on file  Food Insecurity: Not on file  Transportation Needs: Not on file  Physical Activity:  Not on file  Stress: Not on file  Social Connections: Not on file    Allergies: No Known Allergies  Metabolic Disorder Labs: No results found for: "HGBA1C", "MPG" No results found for: "PROLACTIN" No results found for: "CHOL", "TRIG", "HDL", "CHOLHDL", "VLDL", "LDLCALC" No results found for: "TSH"  Therapeutic Level Labs: No results found for: "LITHIUM" No results found for: "VALPROATE" No results found for: "CBMZ"  Current Medications: Current Outpatient Medications  Medication Sig Dispense Refill   buPROPion (WELLBUTRIN) 75 MG tablet Take 1 tablet (75 mg total) by mouth in the morning. 90 tablet 0   DULoxetine (CYMBALTA) 20 MG capsule Take 2 capsules (40 mg total) by mouth daily. Dose change 180 capsule 0   finasteride (PROPECIA) 1 MG tablet Take 1 mg by mouth daily. Takes differently     ibuprofen (ADVIL,MOTRIN) 200 MG tablet Take 200 mg by mouth every 6 (six) hours as needed.     Misc. Devices MISC Compounding pharmacy cream - gaba 5%, keta 5%, keto 5%, lido 5%, amit 2%, carb cream  Apply daily prn pain. Dispense 4 ounces. 5 refills.     pantoprazole (PROTONIX) 40 MG tablet Take 40 mg by mouth daily.     tadalafil (CIALIS) 10 MG tablet Take 10 mg by mouth as needed.     finasteride (PROSCAR) 5 MG tablet Take 5 mg by mouth daily. (Patient not taking: Reported on 09/30/2021)     LAGEVRIO 200 MG CAPS  (Patient not taking: Reported on 09/30/2021)     meloxicam (MOBIC) 15 MG tablet Take 1 tablet by mouth daily. (Patient not taking: Reported on 09/30/2021)     methocarbamol (ROBAXIN) 500 MG tablet Take by mouth. (Patient not taking: Reported on 09/30/2021)     propranolol (INDERAL) 10 MG tablet SMARTSIG:1-2 Tablet(s) By Mouth PRN (Patient not taking: Reported on 09/30/2021)     No current facility-administered medications for this visit.     Musculoskeletal: Strength & Muscle Tone: within normal limits Gait & Station: normal Patient leans: N/A  Psychiatric Specialty  Exam: Review of Systems  Musculoskeletal:  Positive for arthralgias and neck pain.  Psychiatric/Behavioral:  The patient is nervous/anxious.   All other systems reviewed and are negative.   Blood pressure 114/74, pulse 86, temperature 98.3 F (36.8 C), temperature source Temporal, height 5\' 9"  (1.753 m), weight 161 lb 12.8 oz (73.4 kg).Body mass index is 23.89 kg/m.  General Appearance: Casual  Eye Contact:  Fair  Speech:  Clear and Coherent  Volume:  Normal  Mood:   : Anxious- improving  Affect:  Congruent  Thought Process:  Goal Directed and Descriptions of Associations: Intact  Orientation:  Full (Time, Place, and Person)  Thought Content: Logical   Suicidal Thoughts:  No  Homicidal Thoughts:  No  Memory:  Immediate;   Fair Recent;   Fair  Remote;   Fair  Judgement:  Fair  Insight:  Fair  Psychomotor Activity:  Normal  Concentration:  Concentration: Fair and Attention Span: Fair  Recall:  [employer] of Knowledge: Fair  Language: Fair  Akathisia:  No  Handed:  Right  AIMS (if indicated): not done  Assets:  Communication Skills Desire for Improvement Housing Social Support Talents/Skills Transportation Vocational/Educational  ADL's:  Intact  Cognition: WNL  Sleep:  Fair   Screenings: AUDIT    Flowsheet Row Video Visit from 03/25/2021 in [facility]  Alcohol Use Disorder Identification Test Final Score (AUDIT) 5      GAD-7    Flowsheet Row Office Visit from 09/30/2021 in [facility] Video Visit from 07/29/2021 in [facility] Video Visit from 03/25/2021 in [facility] Video Visit from 02/18/2021 in [facility] Video Visit from 11/10/2020 in [facility]  Total GAD-7 Score 0 2 6 7 7       PHQ2-9    Flowsheet Row Office Visit from 09/30/2021 in [facility] Video Visit  from 07/29/2021 in [facility] Video Visit from 03/25/2021 in [facility] Video Visit from 02/18/2021 in [facility] Video Visit from 11/10/2020 in [facility]  PHQ-2 Total Score 0 1 0 0 1  PHQ-9 Total Score 4 -- 3 -- 6      Flowsheet Row Office Visit from 09/30/2021 in [facility] Video Visit from 07/29/2021 in [facility] Video Visit from 06/10/2020 in [facility]  C-SSRS RISK CATEGORY No Risk No Risk No Risk        Assessment and Plan: Bonham Zingale is a 51 year old Caucasian male, employed, lives in [place], married, has a history of GAD, insomnia, GERD, hyperlipidemia, hair loss, sexual dysfunction, chronic pain was evaluated in office today.  Patient is currently improving with regards to his mood however does have possible side effects to Cymbalta, sexual dysfunction.  Patient will benefit from the following plan.  GAD-stable Reduce Cymbalta to 40 mg p.o. daily-due to side effects. Propranolol 10 mg take 1 to 2 tablets daily as needed for anxiety.  Has been limiting use.  Uses it very rarely. Continue psychotherapy session with Ms. [place]  Insomnia-stable Will need sufficient pain management.   Sexual dysfunction-likely due to antidepressant-unstable Start Wellbutrin 75 mg p.o. daily in the morning.  Provided medication education.  Follow-up in clinic in 2 months or sooner if needed.  This note was generated in part or whole with voice recognition software. Voice recognition is usually quite accurate but there are transcription errors that can and very often do occur. I apologize for any typographical errors that were not detected and corrected.    Mike Craze, MD 09/30/2021, 12:50 PM

## 2021-09-30 NOTE — Patient Instructions (Signed)
Bupropion Tablets (Depression/Mood Disorders) ?What is this medication? ?BUPROPION (byoo PROE pee on) treats depression. It increases norepinephrine and dopamine in the brain, hormones that help regulate mood. It belongs to a group of medications called NDRIs. ?This medicine may be used for other purposes; ask your health care provider or pharmacist if you have questions. ?COMMON BRAND NAME(S): Wellbutrin ?What should I tell my care team before I take this medication? ?They need to know if you have any of these conditions: ?An eating disorder, such as anorexia or bulimia ?Bipolar disorder or psychosis ?Diabetes or high blood sugar, treated with medication ?Glaucoma ?Heart disease, previous heart attack, or irregular heart beat ?Head injury or brain tumor ?High blood pressure ?Kidney or liver disease ?Seizures ?Suicidal thoughts or a previous suicide attempt ?Tourette's syndrome ?Weight loss ?An unusual or allergic reaction to bupropion, other medications, foods, dyes, or preservatives ?Pregnant or trying to become pregnant ?Breast-feeding ?How should I use this medication? ?Take this medication by mouth with a glass of water. Follow the directions on the prescription label. You can take it with or without food. If it upsets your stomach, take it with food. Take your medication at regular intervals. Do not take your medication more often than directed. Do not stop taking this medication suddenly except upon the advice of your care team. Stopping this medication too quickly may cause serious side effects or your condition may worsen. ?A special MedGuide will be given to you by the pharmacist with each prescription and refill. Be sure to read this information carefully each time. ?Talk to your care team regarding the use of this medication in children. Special care may be needed. ?Overdosage: If you think you have taken too much of this medicine contact a poison control center or emergency room at once. ?NOTE: This  medicine is only for you. Do not share this medicine with others. ?What if I miss a dose? ?If you miss a dose, take it as soon as you can. If it is less than four hours to your next dose, take only that dose and skip the missed dose. Do not take double or extra doses. ?What may interact with this medication? ?Do not take this medication with any of the following: ?Linezolid ?MAOIs like Azilect, Carbex, Eldepryl, Marplan, Nardil, and Parnate ?Methylene blue (injected into a vein) ?Other medications that contain bupropion like Zyban ?This medication may also interact with the following: ?Alcohol ?Certain medications for anxiety or sleep ?Certain medications for blood pressure like metoprolol, propranolol ?Certain medications for depression or psychotic disturbances ?Certain medications for HIV or AIDS like efavirenz, lopinavir, nelfinavir, ritonavir ?Certain medications for irregular heart beat like propafenone, flecainide ?Certain medications for Parkinson's disease like amantadine, levodopa ?Certain medications for seizures like carbamazepine, phenytoin, phenobarbital ?Cimetidine ?Clopidogrel ?Cyclophosphamide ?Digoxin ?Furazolidone ?Isoniazid ?Nicotine ?Orphenadrine ?Procarbazine ?Steroid medications like prednisone or cortisone ?Stimulant medications for attention disorders, weight loss, or to stay awake ?Tamoxifen ?Theophylline ?Thiotepa ?Ticlopidine ?Tramadol ?Warfarin ?This list may not describe all possible interactions. Give your health care provider a list of all the medicines, herbs, non-prescription drugs, or dietary supplements you use. Also tell them if you smoke, drink alcohol, or use illegal drugs. Some items may interact with your medicine. ?What should I watch for while using this medication? ?Tell your care team if your symptoms do not get better or if they get worse. Visit your care team for regular checks on your progress. Because it may take several weeks to see the full effects of this  medication,   it is important to continue your treatment as prescribed. ?Watch for new or worsening thoughts of suicide or depression. This includes sudden changes in mood, behavior, or thoughts. These changes can happen at any time but are more common in the beginning of treatment or after a change in dose. Call your care team right away if you experience these thoughts or worsening depression. ?Manic episodes may happen in patients with bipolar disorder who take this medication. Watch for changes in feelings or behaviors such as feeling anxious, nervous, agitated, panicky, irritable, hostile, aggressive, impulsive, severely restless, overly excited and hyperactive, or trouble sleeping. These symptoms can happen at anytime but are more common in the beginning of treatment or after a change in dose. Call your care team right away if you notice any of these symptoms. ?This medication may cause serious skin reactions. They can happen weeks to months after starting the medication. Contact your care team right away if you notice fevers or flu-like symptoms with a rash. The rash may be red or purple and then turn into blisters or peeling of the skin. Or, you might notice a red rash with swelling of the face, lips or lymph nodes in your neck or under your arms. ?Avoid drinks that contain alcohol while taking this medication. Drinking large amounts of alcohol, using sleeping or anxiety medications, or quickly stopping the use of these agents while taking this medication may increase your risk for a seizure. ?Do not drive or use heavy machinery until you know how this medication affects you. This medication can impair your ability to perform these tasks. ?Do not take this medication close to bedtime. It may prevent you from sleeping. ?Your mouth may get dry. Chewing sugarless gum or sucking hard candy, and drinking plenty of water may help. Contact your care team if the problem does not go away or is severe. ?What side  effects may I notice from receiving this medication? ?Side effects that you should report to your care team as soon as possible: ?Allergic reactions--skin rash, itching, hives, swelling of the face, lips, tongue, or throat ?Increase in blood pressure ?Mood and behavior changes--anxiety, nervousness, confusion, hallucinations, irritability, hostility, thoughts of suicide or self-harm, worsening mood, feelings of depression ?Redness, blistering, peeling, or loosening of the skin, including inside the mouth ?Seizures ?Sudden eye pain or change in vision such as blurry vision, seeing halos around lights, vision loss ?Side effects that usually do not require medical attention (report to your care team if they continue or are bothersome): ?Constipation ?Dizziness ?Dry mouth ?Loss of appetite ?Nausea ?Tremors or shaking ?Trouble sleeping ?This list may not describe all possible side effects. Call your doctor for medical advice about side effects. You may report side effects to FDA at 1-800-FDA-1088. ?Where should I keep my medication? ?Keep out of the reach of children and pets. ?Store at room temperature between 20 and 25 degrees C (68 and 77 degrees F), away from direct sunlight and moisture. Keep tightly closed. Throw away any unused medication after the expiration date. ?NOTE: This sheet is a summary. It may not cover all possible information. If you have questions about this medicine, talk to your doctor, pharmacist, or health care provider. ?? 2023 Elsevier/Gold Standard (2020-05-19 00:00:00) ? ?

## 2021-10-01 ENCOUNTER — Other Ambulatory Visit: Payer: Self-pay | Admitting: Psychiatry

## 2021-10-01 DIAGNOSIS — F411 Generalized anxiety disorder: Secondary | ICD-10-CM

## 2021-10-14 DIAGNOSIS — F411 Generalized anxiety disorder: Secondary | ICD-10-CM | POA: Diagnosis not present

## 2021-12-02 ENCOUNTER — Telehealth (INDEPENDENT_AMBULATORY_CARE_PROVIDER_SITE_OTHER): Payer: BC Managed Care – PPO | Admitting: Psychiatry

## 2021-12-02 ENCOUNTER — Encounter: Payer: Self-pay | Admitting: Psychiatry

## 2021-12-02 DIAGNOSIS — R37 Sexual dysfunction, unspecified: Secondary | ICD-10-CM | POA: Diagnosis not present

## 2021-12-02 DIAGNOSIS — F411 Generalized anxiety disorder: Secondary | ICD-10-CM

## 2021-12-02 DIAGNOSIS — G4701 Insomnia due to medical condition: Secondary | ICD-10-CM | POA: Diagnosis not present

## 2021-12-02 MED ORDER — BUPROPION HCL 75 MG PO TABS: 75.0000 mg | ORAL_TABLET | Freq: Every morning | ORAL | 1 refills | Status: DC

## 2021-12-02 MED ORDER — DULOXETINE HCL 20 MG PO CPEP: 40.0000 mg | ORAL_CAPSULE | Freq: Every day | ORAL | 1 refills | Status: DC

## 2021-12-02 MED ORDER — ZOLPIDEM TARTRATE 5 MG PO TABS: 5.0000 mg | ORAL_TABLET | Freq: Every evening | ORAL | 2 refills | Status: DC | PRN

## 2021-12-02 NOTE — Progress Notes (Signed)
Virtual Visit via Video Note  I connected with Jose Maddox on 12/02/21 at  9:00 AM EDT by a video enabled telemedicine application and verified that I am speaking with the correct person using two identifiers.  Location Provider Location : [facility] Patient Location : Home  Participants: Patient , Provider    I discussed the limitations of evaluation and management by telemedicine and the availability of in person appointments. The patient expressed understanding and agreed to proceed.    I discussed the assessment and treatment plan with the patient. The patient was provided an opportunity to ask questions and all were answered. The patient agreed with the plan and demonstrated an understanding of the instructions.   The patient was advised to call back or seek an in-person evaluation if the symptoms worsen or if the condition fails to improve as anticipated. [facility] MD OP Progress Note  12/02/2021 9:28 AM Jose Maddox  MRN:  [id number]  Chief Complaint:  Chief Complaint  Patient presents with   Follow-up: 51 year old Caucasian male with history of generalized anxiety, chronic pain, presented for medication management.   HPI: Jose Maddox is a 51 year old Caucasian male, employed, married, lives in [place], has a history of GAD, insomnia, gastroesophageal reflux disease, hyperlipidemia, erectile dysfunction, nonscarring hair loss was evaluated by telemedicine today.  Patient today reports overall anxiety symptoms are stable.  He does have mild anxiety on and off however manageable.  Also reports history of anxiety while driving usually when it start getting darker, days get shorter.  Patient however reports he has been coping better than before on the current medication regimen.  Mostly compliant on the Cymbalta and the Wellbutrin.  He had 1 day recently when he missed the dose since it was busy at work.  Reports he had withdrawal symptoms like muscle cramps.   Continues to have  sexual side effects , likely from Cymbalta.  Wellbutrin was recently added, has not noticed much difference yet.  Would like to give it more time.  Reports sleep is overall okay.  However would like to have Ambien available just in case.  Continues to stay away from alcohol.  Denies any suicidality, homicidality or perceptual disturbances.  Denies any other concerns today.  Visit Diagnosis:    ICD-10-CM   1. GAD (generalized anxiety disorder)  F41.1 DULoxetine (CYMBALTA) 20 MG capsule    2. Insomnia due to medical condition  G47.01 zolpidem (AMBIEN) 5 MG tablet   mood    3. Sexual dysfunction  R37 buPROPion (WELLBUTRIN) 75 MG tablet    DULoxetine (CYMBALTA) 20 MG capsule   likely due to antidepressant      Past Psychiatric History: Reviewed past psychiatric history from progress note on 06/10/2020.  Past Medical History:  Past Medical History:  Diagnosis Date   Anxiety    GERD (gastroesophageal reflux disease)    Hyperlipidemia    IBS (irritable bowel syndrome)     Past Surgical History:  Procedure Laterality Date   FORAMINOTOMY 1 LEVEL     C7   WRIST SURGERY Left 2004    Family Psychiatric History: Reviewed family psychiatric history from progress note on 06/10/2020.  Family History:  Family History  Problem Relation Age of Onset   Uterine cancer Other        Grandmother   Arthritis Father    Hyperlipidemia Father    Alcohol abuse Father    Alzheimer's disease Paternal Grandmother    Alcoholism Paternal Grandfather    Cirrhosis Paternal Actor  Alcohol abuse Paternal Grandfather    Drug abuse Paternal Uncle    Drug abuse Paternal Uncle     Social History: Reviewed social history from progress note on 06/10/2020. Social History   Socioeconomic History   Marital status: Married    Spouse name: Not on file   Number of children: 2   Years of education: Not on file   Highest education level: Not on file  Occupational History   Not on file  Tobacco  Use   Smoking status: Former   Smokeless tobacco: Former    Types: Snuff    Quit date: 05/13/2017  Substance and Sexual Activity   Alcohol use: Yes    Alcohol/week: 6.0 standard drinks of alcohol    Types: 6 Cans of beer per week   Drug use: Not on file    Comment: twice a year   Sexual activity: Yes  Other Topics Concern   Not on file  Social History Narrative   Not on file   Social Determinants of Health   Financial Resource Strain: Not on file  Food Insecurity: Not on file  Transportation Needs: Not on file  Physical Activity: Not on file  Stress: Not on file  Social Connections: Not on file    Allergies: No Known Allergies  Metabolic Disorder Labs: No results found for: "HGBA1C", "MPG" No results found for: "PROLACTIN" No results found for: "CHOL", "TRIG", "HDL", "CHOLHDL", "VLDL", "LDLCALC" No results found for: "TSH"  Therapeutic Level Labs: No results found for: "LITHIUM" No results found for: "VALPROATE" No results found for: "CBMZ"  Current Medications: Current Outpatient Medications  Medication Sig Dispense Refill   finasteride (PROPECIA) 1 MG tablet Take 1 mg by mouth daily. Takes differently     ibuprofen (ADVIL,MOTRIN) 200 MG tablet Take 200 mg by mouth every 6 (six) hours as needed.     Misc. Devices MISC Compounding pharmacy cream - gaba 5%, keta 5%, keto 5%, lido 5%, amit 2%, carb cream  Apply daily prn pain. Dispense 4 ounces. 5 refills.     pantoprazole (PROTONIX) 40 MG tablet Take 40 mg by mouth daily.     propranolol (INDERAL) 10 MG tablet      tadalafil (CIALIS) 10 MG tablet Take 10 mg by mouth as needed.     zolpidem (AMBIEN) 5 MG tablet Take 1 tablet (5 mg total) by mouth at bedtime as needed for sleep. 30 tablet 2   buPROPion (WELLBUTRIN) 75 MG tablet Take 1 tablet (75 mg total) by mouth in the morning. 90 tablet 1   DULoxetine (CYMBALTA) 20 MG capsule Take 2 capsules (40 mg total) by mouth daily. Dose change 180 capsule 1   No current  facility-administered medications for this visit.     Musculoskeletal: Strength & Muscle Tone:  UTA Gait & Station:  Seated Patient leans: N/A  Psychiatric Specialty Exam: Review of Systems  Musculoskeletal:  Positive for arthralgias (Chronic).  Psychiatric/Behavioral:  The patient is nervous/anxious (Manageable).   All other systems reviewed and are negative.   There were no vitals taken for this visit.There is no height or weight on file to calculate BMI.  General Appearance: Casual  Eye Contact:  Fair  Speech:  Clear and Coherent  Volume:  Normal  Mood:  Anxious improved and coping well  Affect:  Congruent  Thought Process:  Goal Directed and Descriptions of Associations: Intact  Orientation:  Full (Time, Place, and Person)  Thought Content: Logical   Suicidal Thoughts:  No  Homicidal Thoughts:  No  Memory:  Immediate;   Fair Recent;   Fair Remote;   Fair  Judgement:  Fair  Insight:  Fair  Psychomotor Activity:  Normal  Concentration:  Concentration: Fair and Attention Span: Fair  Recall:  [employer] of Knowledge: Fair  Language: Fair  Akathisia:  No  Handed:  Right  AIMS (if indicated): not done  Assets:  Communication Skills Desire for Improvement Housing Social Support  ADL's:  Intact  Cognition: WNL  Sleep:  Fair   Screenings: AUDIT    Flowsheet Row Video Visit from 03/25/2021 in [facility]  Alcohol Use Disorder Identification Test Final Score (AUDIT) 5      GAD-7    Flowsheet Row Video Visit from 12/02/2021 in [facility] Office Visit from 09/30/2021 in [facility] Video Visit from 07/29/2021 in [facility] Video Visit from 03/25/2021 in [facility] Video Visit from 02/18/2021 in [facility]  Total GAD-7 Score 1 0 2 6 7       PHQ2-9    Flowsheet Row Video Visit from 12/02/2021 in  [facility] Office Visit from 09/30/2021 in [facility] Video Visit from 07/29/2021 in [facility] Video Visit from 03/25/2021 in [facility] Video Visit from 02/18/2021 in [facility]  PHQ-2 Total Score 0 0 1 0 0  PHQ-9 Total Score -- 4 -- 3 --      Flowsheet Row Video Visit from 12/02/2021 in [facility] Office Visit from 09/30/2021 in [facility] Video Visit from 07/29/2021 in [facility]  C-SSRS RISK CATEGORY No Risk No Risk No Risk        Assessment and Plan: Jose Maddox is a 51 year old Caucasian male, employed, lives in [place], married, has a history of GAD, insomnia, GERD, hyperlipidemia, hair loss, sexual dysfunction, chronic pain was evaluated by telemedicine today.  Patient is currently stable although continues to have possible sexual side effects likely due to Cymbalta.  Plan as noted below.  Plan GAD-stable Cymbalta 40 mg p.o. daily-reduced dosage due to side effects. Continue propranolol 10 mg take 1 to 2 tablets daily as needed for anxiety Continue psychotherapy sessions with [place].  Insomnia-stable Zolpidem 5 mg p.o. nightly as needed-available. Will need sufficient pain management.  Sexual dysfunction likely due to Cymbalta-unstable Continue Wellbutrin 75 mg p.o. daily in the morning.  Will consider increasing the dosage as needed in the future.   Follow-up in clinic in 4 to 5 months or sooner if needed.  Collaboration of Care: Collaboration of Care: Referral or follow-up with counselor/therapist AEB encouraged to continue to follow up with therapist.  Patient/Guardian was advised Release of Information must be obtained prior to any record release in order to collaborate their care with an outside provider. Patient/Guardian  was advised if they have not already done so to contact the registration department to sign all necessary forms in order for Rise Paganini to release information regarding their care.   Consent: Patient/Guardian gives verbal consent for treatment and assignment of benefits for services provided during this visit. Patient/Guardian expressed understanding and agreed to proceed.   This note was generated in part or whole with voice recognition software. Voice recognition is usually quite accurate but there are transcription errors that can and very often do occur. I apologize for any typographical errors that were not detected and corrected.  Jomarie Longs, MD 12/02/2021, 9:28 AM

## 2021-12-30 DIAGNOSIS — F411 Generalized anxiety disorder: Secondary | ICD-10-CM | POA: Diagnosis not present

## 2022-01-20 DIAGNOSIS — F411 Generalized anxiety disorder: Secondary | ICD-10-CM | POA: Diagnosis not present

## 2022-03-17 DIAGNOSIS — F411 Generalized anxiety disorder: Secondary | ICD-10-CM | POA: Diagnosis not present

## 2022-03-24 DIAGNOSIS — Z125 Encounter for screening for malignant neoplasm of prostate: Secondary | ICD-10-CM | POA: Diagnosis not present

## 2022-03-24 DIAGNOSIS — E785 Hyperlipidemia, unspecified: Secondary | ICD-10-CM | POA: Diagnosis not present

## 2022-03-24 DIAGNOSIS — F419 Anxiety disorder, unspecified: Secondary | ICD-10-CM | POA: Diagnosis not present

## 2022-03-31 DIAGNOSIS — Z0000 Encounter for general adult medical examination without abnormal findings: Secondary | ICD-10-CM | POA: Diagnosis not present

## 2022-03-31 DIAGNOSIS — Z1339 Encounter for screening examination for other mental health and behavioral disorders: Secondary | ICD-10-CM | POA: Diagnosis not present

## 2022-03-31 DIAGNOSIS — M542 Cervicalgia: Secondary | ICD-10-CM | POA: Diagnosis not present

## 2022-03-31 DIAGNOSIS — Z1331 Encounter for screening for depression: Secondary | ICD-10-CM | POA: Diagnosis not present

## 2022-04-14 ENCOUNTER — Encounter: Payer: Self-pay | Admitting: Psychiatry

## 2022-04-14 ENCOUNTER — Telehealth (INDEPENDENT_AMBULATORY_CARE_PROVIDER_SITE_OTHER): Payer: BC Managed Care – PPO | Admitting: Psychiatry

## 2022-04-14 DIAGNOSIS — G4701 Insomnia due to medical condition: Secondary | ICD-10-CM

## 2022-04-14 DIAGNOSIS — F411 Generalized anxiety disorder: Secondary | ICD-10-CM

## 2022-04-14 NOTE — Patient Instructions (Signed)
  [email or web address]  www.psychologytoday  [facility] [email or web address] [address], [place], [place] [address]   [phone]  [facility] [email or web address] [address], [place], [place] [address]  [phone]   [facility] - [phone]  [facility] - [phone]  [facility] - [phone]   [facility] - [phone]  [facility] - [phone]     

## 2022-04-14 NOTE — Progress Notes (Signed)
Virtual Visit via Video Note  I connected with Jose Maddox on 04/14/22 at  9:00 AM EST by a video enabled telemedicine application and verified that I am speaking with the correct person using two identifiers.  Location Provider Location : [facility] Patient Location : [place]  Participants: Patient , Provider   I discussed the limitations of evaluation and management by telemedicine and the availability of in person appointments. The patient expressed understanding and agreed to proceed.   I discussed the assessment and treatment plan with the patient. The patient was provided an opportunity to ask questions and all were answered. The patient agreed with the plan and demonstrated an understanding of the instructions.   The patient was advised to call back or seek an in-person evaluation if the symptoms worsen or if the condition fails to improve as anticipated.   [facility] MD OP Progress Note  04/14/2022 1:19 PM Jose Maddox  MRN:  [id number]  Chief Complaint:  Chief Complaint  Patient presents with   Follow-up   Medication Refill   Anxiety   Depression   HPI: Jose Maddox is a 52 year old Caucasian male, employed, married, lives in [place], has a history of GAD, insomnia, gastroesophageal reflux disease, hyperlipidemia, erectile dysfunction, nonscarring hair loss was evaluated by telemedicine today.  Patient today reports he was able to taper himself off of the Wellbutrin and Cymbalta 6 weeks ago.  Patient reports although the medication helped to some extent with his low energy, anxiety he felt as though he was relaxed to the point that he was not caring anymore.  He did not like that feeling and hence decided to taper off the medication.  Patient reports that did go well and he did not have any withdrawal symptoms.  He does not currently have any relapse of depressive symptoms or anxiety symptoms.  He reports he used to feel anxious when he used to drive especially at night and that is  also not there anymore.  He continues to follow-up with his therapist in [place] every 2 to 3 weeks and that has been beneficial.  However he would like to find a new therapist, and explore other therapy options if possible.  Patient reports sleep is overall good.  He quit the alcohol and currently is sleeping better.  Does have the zolpidem available.  Patient reports he does not have any suicidality or homicidality.  Denies any perceptual disturbances.  Patient reports he is interested in trying holistic approach for his anxiety symptoms and chronic fatigue and has an appointment with [facility] in [place].  Looks forward to that.  Patient denies any other concerns today.  Visit Diagnosis:    ICD-10-CM   1. GAD (generalized anxiety disorder)  F41.1     2. Insomnia due to medical condition  G47.01    mood      Past Psychiatric History: Reviewed past psychiatric history from progress note on 06/10/2020.  Past Medical History:  Past Medical History:  Diagnosis Date   Anxiety    GERD (gastroesophageal reflux disease)    Hyperlipidemia    IBS (irritable bowel syndrome)     Past Surgical History:  Procedure Laterality Date   FORAMINOTOMY 1 LEVEL     C7   WRIST SURGERY Left 2004    Family Psychiatric History: Reviewed family psychiatric history from progress note on 06/10/2020.  Family History:  Family History  Problem Relation Age of Onset   Uterine cancer Other        Grandmother  Arthritis Father    Hyperlipidemia Father    Alcohol abuse Father    Alzheimer's disease Paternal Grandmother    Alcoholism Paternal Grandfather    Cirrhosis Paternal Grandfather    Alcohol abuse Paternal Grandfather    Drug abuse Paternal Uncle    Drug abuse Paternal Uncle     Social History: Reviewed social history from progress note on 06/10/2020. Social History   Socioeconomic History   Marital status: Married    Spouse name: Not on file   Number of children:  2   Years of education: Not on file   Highest education level: Not on file  Occupational History   Not on file  Tobacco Use   Smoking status: Former   Smokeless tobacco: Former    Types: Snuff    Quit date: 05/13/2017  Substance and Sexual Activity   Alcohol use: Yes    Alcohol/week: 6.0 standard drinks of alcohol    Types: 6 Cans of beer per week   Drug use: Not on file    Comment: twice a year   Sexual activity: Yes  Other Topics Concern   Not on file  Social History Narrative   Not on file   Social Determinants of Health   Financial Resource Strain: Not on file  Food Insecurity: Not on file  Transportation Needs: Not on file  Physical Activity: Not on file  Stress: Not on file  Social Connections: Not on file    Allergies: No Known Allergies  Metabolic Disorder Labs: No results found for: "HGBA1C", "MPG" No results found for: "PROLACTIN" No results found for: "CHOL", "TRIG", "HDL", "CHOLHDL", "VLDL", "LDLCALC" No results found for: "TSH"  Therapeutic Level Labs: No results found for: "LITHIUM" No results found for: "VALPROATE" No results found for: "CBMZ"  Current Medications: Current Outpatient Medications  Medication Sig Dispense Refill   finasteride (PROPECIA) 1 MG tablet Take 1 mg by mouth daily. Takes differently     ibuprofen (ADVIL,MOTRIN) 200 MG tablet Take 200 mg by mouth every 6 (six) hours as needed.     Misc. Devices MISC Compounding pharmacy cream - gaba 5%, keta 5%, keto 5%, lido 5%, amit 2%, carb cream  Apply daily prn pain. Dispense 4 ounces. 5 refills.     pantoprazole (PROTONIX) 40 MG tablet Take 40 mg by mouth daily.     propranolol (INDERAL) 10 MG tablet      tadalafil (CIALIS) 10 MG tablet Take 10 mg by mouth as needed.     zolpidem (AMBIEN) 5 MG tablet Take 1 tablet (5 mg total) by mouth at bedtime as needed for sleep. (Patient not taking: Reported on 04/14/2022) 30 tablet 2   No current facility-administered medications for this visit.      Musculoskeletal: Strength & Muscle Tone:  UTA Gait & Station:  Seated Patient leans: N/A  Psychiatric Specialty Exam: Review of Systems  Psychiatric/Behavioral: Negative.    All other systems reviewed and are negative.   There were no vitals taken for this visit.There is no height or weight on file to calculate BMI.  General Appearance: Casual  Eye Contact:  Fair  Speech:  Normal Rate  Volume:  Normal  Mood:  Euthymic  Affect:  Congruent  Thought Process:  Goal Directed and Descriptions of Associations: Intact  Orientation:  Full (Time, Place, and Person)  Thought Content: Logical   Suicidal Thoughts:  No  Homicidal Thoughts:  No  Memory:  Immediate;   Fair Recent;   Fair Remote;  Fair  Judgement:  Fair  Insight:  Fair  Psychomotor Activity:  Normal  Concentration:  Concentration: Fair and Attention Span: Fair  Recall:  [employer] of Knowledge: Fair  Language: Fair  Akathisia:  No  Handed:  Right  AIMS (if indicated): not done  Assets:  Communication Skills Desire for Improvement Housing Social Support  ADL's:  Intact  Cognition: WNL  Sleep:  Fair   Screenings: AUDIT    Flowsheet Row Video Visit from 03/25/2021 in [facility]  Alcohol Use Disorder Identification Test Final Score (AUDIT) 5      GAD-7    Flowsheet Row Video Visit from 04/14/2022 in [facility] Video Visit from 12/02/2021 in [facility] Office Visit from 09/30/2021 in [facility] Video Visit from 07/29/2021 in [facility] Video Visit from 03/25/2021 in [facility]  Total GAD-7 Score 0 1 0 2 6      PHQ2-9    Flowsheet Row Video Visit from 04/14/2022 in [facility] Video Visit from 12/02/2021 in [facility] Office Visit from 09/30/2021 in [facility] Video Visit from 07/29/2021 in [facility] Video Visit from 03/25/2021 in [facility]  PHQ-2 Total Score 0 0 0 1 0  PHQ-9 Total Score -- -- 4 -- 3      Flowsheet Row Video Visit from 04/14/2022 in [facility] Video Visit from 12/02/2021 in [facility] Office Visit from 09/30/2021 in [facility]  C-SSRS RISK CATEGORY No Risk No Risk No Risk        Assessment and Plan: Jose Maddox is a 52 year old Caucasian male, employed, lives in [place], married, has a history of GAD, insomnia, GERD, multiple other medical problems including chronic pain was evaluated by telemedicine today.  Patient currently noncompliant with medications, taper himself off of the Cymbalta, Wellbutrin.  He is interested in holistic medicine as well as continuing psychotherapy sessions.  Discussed plan as noted below.  Plan GAD-stable Patient advised to continue follow-up with therapist. Provided information for Mr. Daiva Eves in [place] psychotherapy alternative options. Discontinue Cymbalta and Wellbutrin for noncompliance.   Insomnia-stable Zolpidem 5 mg p.o. nightly as needed.  Follow-up in clinic as needed.  Consent: Patient/Guardian gives verbal consent for treatment and assignment of benefits for services provided during this visit. Patient/Guardian expressed understanding and agreed to proceed.   This note was generated in part or whole with voice recognition software. Voice recognition is usually quite accurate but there are transcription errors that can and very often do occur. I apologize for any typographical errors that were not detected and corrected.      Jomarie Longs, MD 04/14/2022, 1:19 PM

## 2022-04-28 DIAGNOSIS — F411 Generalized anxiety disorder: Secondary | ICD-10-CM | POA: Diagnosis not present

## 2022-05-19 DIAGNOSIS — R5383 Other fatigue: Secondary | ICD-10-CM | POA: Diagnosis not present

## 2022-05-19 DIAGNOSIS — E611 Iron deficiency: Secondary | ICD-10-CM | POA: Diagnosis not present

## 2022-05-19 DIAGNOSIS — E538 Deficiency of other specified B group vitamins: Secondary | ICD-10-CM | POA: Diagnosis not present

## 2022-05-19 DIAGNOSIS — D729 Disorder of white blood cells, unspecified: Secondary | ICD-10-CM | POA: Diagnosis not present

## 2022-05-19 DIAGNOSIS — E291 Testicular hypofunction: Secondary | ICD-10-CM | POA: Diagnosis not present

## 2022-05-19 DIAGNOSIS — E559 Vitamin D deficiency, unspecified: Secondary | ICD-10-CM | POA: Diagnosis not present

## 2022-05-19 DIAGNOSIS — F411 Generalized anxiety disorder: Secondary | ICD-10-CM | POA: Diagnosis not present

## 2022-05-19 DIAGNOSIS — Z131 Encounter for screening for diabetes mellitus: Secondary | ICD-10-CM | POA: Diagnosis not present

## 2022-05-19 DIAGNOSIS — M2550 Pain in unspecified joint: Secondary | ICD-10-CM | POA: Diagnosis not present

## 2022-06-02 DIAGNOSIS — F4322 Adjustment disorder with anxiety: Secondary | ICD-10-CM | POA: Diagnosis not present

## 2022-06-09 DIAGNOSIS — D225 Melanocytic nevi of trunk: Secondary | ICD-10-CM | POA: Diagnosis not present

## 2022-06-09 DIAGNOSIS — D2261 Melanocytic nevi of right upper limb, including shoulder: Secondary | ICD-10-CM | POA: Diagnosis not present

## 2022-06-09 DIAGNOSIS — L821 Other seborrheic keratosis: Secondary | ICD-10-CM | POA: Diagnosis not present

## 2022-06-16 DIAGNOSIS — F411 Generalized anxiety disorder: Secondary | ICD-10-CM | POA: Diagnosis not present

## 2022-07-07 DIAGNOSIS — E612 Magnesium deficiency: Secondary | ICD-10-CM | POA: Diagnosis not present

## 2022-07-07 DIAGNOSIS — M2550 Pain in unspecified joint: Secondary | ICD-10-CM | POA: Diagnosis not present

## 2022-07-07 DIAGNOSIS — E291 Testicular hypofunction: Secondary | ICD-10-CM | POA: Diagnosis not present

## 2022-07-07 DIAGNOSIS — R5383 Other fatigue: Secondary | ICD-10-CM | POA: Diagnosis not present

## 2022-08-11 DIAGNOSIS — F411 Generalized anxiety disorder: Secondary | ICD-10-CM | POA: Diagnosis not present

## 2022-09-15 DIAGNOSIS — F411 Generalized anxiety disorder: Secondary | ICD-10-CM | POA: Diagnosis not present

## 2022-10-20 ENCOUNTER — Other Ambulatory Visit: Payer: Self-pay | Admitting: Orthopedic Surgery

## 2022-10-20 DIAGNOSIS — M5412 Radiculopathy, cervical region: Secondary | ICD-10-CM

## 2022-11-17 ENCOUNTER — Other Ambulatory Visit: Payer: BC Managed Care – PPO

## 2022-11-17 DIAGNOSIS — F411 Generalized anxiety disorder: Secondary | ICD-10-CM | POA: Diagnosis not present

## 2022-11-24 ENCOUNTER — Inpatient Hospital Stay: Admission: RE | Admit: 2022-11-24 | Payer: BC Managed Care – PPO | Source: Ambulatory Visit

## 2022-12-15 DIAGNOSIS — F411 Generalized anxiety disorder: Secondary | ICD-10-CM | POA: Diagnosis not present

## 2022-12-29 DIAGNOSIS — L821 Other seborrheic keratosis: Secondary | ICD-10-CM | POA: Diagnosis not present

## 2022-12-29 DIAGNOSIS — L570 Actinic keratosis: Secondary | ICD-10-CM | POA: Diagnosis not present

## 2022-12-29 DIAGNOSIS — L820 Inflamed seborrheic keratosis: Secondary | ICD-10-CM | POA: Diagnosis not present

## 2023-01-12 DIAGNOSIS — F411 Generalized anxiety disorder: Secondary | ICD-10-CM | POA: Diagnosis not present

## 2023-02-02 DIAGNOSIS — F411 Generalized anxiety disorder: Secondary | ICD-10-CM | POA: Diagnosis not present

## 2023-03-02 DIAGNOSIS — F411 Generalized anxiety disorder: Secondary | ICD-10-CM | POA: Diagnosis not present

## 2023-03-08 NOTE — Discharge Instructions (Signed)

## 2023-03-09 ENCOUNTER — Ambulatory Visit
Admission: RE | Admit: 2023-03-09 | Discharge: 2023-03-09 | Disposition: A | Discharge status: Home / Self Care | Payer: BC Managed Care – PPO | Source: Ambulatory Visit | Attending: Orthopedic Surgery | Admitting: Orthopedic Surgery

## 2023-03-09 DIAGNOSIS — M4722 Other spondylosis with radiculopathy, cervical region: Secondary | ICD-10-CM | POA: Diagnosis not present

## 2023-03-09 DIAGNOSIS — M5412 Radiculopathy, cervical region: Secondary | ICD-10-CM

## 2023-03-09 MED ORDER — IOPAMIDOL (ISOVUE-M 300) INJECTION 61%
1.0000 mL | Freq: Once | INTRAMUSCULAR | Status: AC | PRN
  Administered 2023-03-09: 1 mL via EPIDURAL

## 2023-03-09 MED ORDER — TRIAMCINOLONE ACETONIDE 40 MG/ML IJ SUSP (RADIOLOGY)
60.0000 mg | Freq: Once | INTRAMUSCULAR | Status: AC
  Administered 2023-03-09: 60 mg via EPIDURAL

## 2023-04-06 DIAGNOSIS — Z125 Encounter for screening for malignant neoplasm of prostate: Secondary | ICD-10-CM | POA: Diagnosis not present

## 2023-04-06 DIAGNOSIS — F411 Generalized anxiety disorder: Secondary | ICD-10-CM | POA: Diagnosis not present

## 2023-04-06 DIAGNOSIS — E785 Hyperlipidemia, unspecified: Secondary | ICD-10-CM | POA: Diagnosis not present

## 2023-04-27 DIAGNOSIS — Z0000 Encounter for general adult medical examination without abnormal findings: Secondary | ICD-10-CM | POA: Diagnosis not present

## 2023-04-27 DIAGNOSIS — Z1331 Encounter for screening for depression: Secondary | ICD-10-CM | POA: Diagnosis not present

## 2023-04-27 DIAGNOSIS — Z1339 Encounter for screening examination for other mental health and behavioral disorders: Secondary | ICD-10-CM | POA: Diagnosis not present

## 2023-04-27 DIAGNOSIS — K219 Gastro-esophageal reflux disease without esophagitis: Secondary | ICD-10-CM | POA: Diagnosis not present

## 2023-05-18 DIAGNOSIS — F411 Generalized anxiety disorder: Secondary | ICD-10-CM | POA: Diagnosis not present

## 2023-05-25 DIAGNOSIS — M25511 Pain in right shoulder: Secondary | ICD-10-CM | POA: Diagnosis not present

## 2023-06-01 DIAGNOSIS — M25511 Pain in right shoulder: Secondary | ICD-10-CM | POA: Diagnosis not present

## 2023-06-08 DIAGNOSIS — M25511 Pain in right shoulder: Secondary | ICD-10-CM | POA: Diagnosis not present

## 2023-06-26 ENCOUNTER — Other Ambulatory Visit: Payer: Self-pay | Admitting: Orthopedic Surgery

## 2023-06-26 DIAGNOSIS — M5412 Radiculopathy, cervical region: Secondary | ICD-10-CM

## 2023-06-29 DIAGNOSIS — M67911 Unspecified disorder of synovium and tendon, right shoulder: Secondary | ICD-10-CM | POA: Diagnosis not present

## 2023-06-29 DIAGNOSIS — M25511 Pain in right shoulder: Secondary | ICD-10-CM | POA: Diagnosis not present

## 2023-07-09 DIAGNOSIS — R051 Acute cough: Secondary | ICD-10-CM | POA: Diagnosis not present

## 2023-07-09 DIAGNOSIS — R509 Fever, unspecified: Secondary | ICD-10-CM | POA: Diagnosis not present

## 2023-07-09 NOTE — Progress Notes (Addendum)
 Cough fever for 3 days  Pt did at home Covid test and was negative

## 2023-07-13 ENCOUNTER — Other Ambulatory Visit

## 2023-07-13 DIAGNOSIS — M25511 Pain in right shoulder: Secondary | ICD-10-CM | POA: Diagnosis not present

## 2023-07-18 NOTE — Discharge Instructions (Signed)

## 2023-07-20 ENCOUNTER — Ambulatory Visit
Admission: RE | Admit: 2023-07-20 | Discharge: 2023-07-20 | Disposition: A | Discharge status: Home / Self Care | Source: Ambulatory Visit | Attending: Orthopedic Surgery | Admitting: Orthopedic Surgery

## 2023-07-20 DIAGNOSIS — D518 Other vitamin B12 deficiency anemias: Secondary | ICD-10-CM | POA: Diagnosis not present

## 2023-07-20 DIAGNOSIS — Z125 Encounter for screening for malignant neoplasm of prostate: Secondary | ICD-10-CM | POA: Diagnosis not present

## 2023-07-20 DIAGNOSIS — R946 Abnormal results of thyroid function studies: Secondary | ICD-10-CM | POA: Diagnosis not present

## 2023-07-20 DIAGNOSIS — E291 Testicular hypofunction: Secondary | ICD-10-CM | POA: Diagnosis not present

## 2023-07-20 DIAGNOSIS — M4722 Other spondylosis with radiculopathy, cervical region: Secondary | ICD-10-CM | POA: Diagnosis not present

## 2023-07-20 DIAGNOSIS — E559 Vitamin D deficiency, unspecified: Secondary | ICD-10-CM | POA: Diagnosis not present

## 2023-07-20 DIAGNOSIS — M5412 Radiculopathy, cervical region: Secondary | ICD-10-CM

## 2023-07-20 DIAGNOSIS — E279 Disorder of adrenal gland, unspecified: Secondary | ICD-10-CM | POA: Diagnosis not present

## 2023-07-20 DIAGNOSIS — Z131 Encounter for screening for diabetes mellitus: Secondary | ICD-10-CM | POA: Diagnosis not present

## 2023-07-20 DIAGNOSIS — Z1329 Encounter for screening for other suspected endocrine disorder: Secondary | ICD-10-CM | POA: Diagnosis not present

## 2023-07-20 MED ORDER — IOPAMIDOL (ISOVUE-M 300) INJECTION 61%
1.0000 mL | Freq: Once | INTRAMUSCULAR | Status: AC | PRN
  Administered 2023-07-20: 1 mL via EPIDURAL

## 2023-07-20 MED ORDER — TRIAMCINOLONE ACETONIDE 40 MG/ML IJ SUSP (RADIOLOGY)
60.0000 mg | Freq: Once | INTRAMUSCULAR | Status: AC
  Administered 2023-07-20: 60 mg via EPIDURAL

## 2023-10-26 ENCOUNTER — Other Ambulatory Visit: Payer: Self-pay | Admitting: Family Medicine

## 2023-10-26 DIAGNOSIS — M25511 Pain in right shoulder: Secondary | ICD-10-CM

## 2023-11-23 ENCOUNTER — Ambulatory Visit
Admission: RE | Admit: 2023-11-23 | Discharge: 2023-11-23 | Disposition: A | Discharge status: Home / Self Care | Source: Ambulatory Visit | Attending: Family Medicine | Admitting: Family Medicine

## 2023-11-23 ENCOUNTER — Encounter: Payer: Self-pay | Admitting: Psychiatry

## 2023-11-23 ENCOUNTER — Telehealth: Admitting: Psychiatry

## 2023-11-23 DIAGNOSIS — F411 Generalized anxiety disorder: Secondary | ICD-10-CM

## 2023-11-23 DIAGNOSIS — M130 Polyarthritis, unspecified: Secondary | ICD-10-CM | POA: Insufficient documentation

## 2023-11-23 DIAGNOSIS — F32A Depression, unspecified: Secondary | ICD-10-CM | POA: Diagnosis not present

## 2023-11-23 DIAGNOSIS — M25511 Pain in right shoulder: Secondary | ICD-10-CM

## 2023-11-23 DIAGNOSIS — R41840 Attention and concentration deficit: Secondary | ICD-10-CM

## 2023-11-23 DIAGNOSIS — R52 Pain, unspecified: Secondary | ICD-10-CM | POA: Diagnosis not present

## 2023-11-23 DIAGNOSIS — G4701 Insomnia due to medical condition: Secondary | ICD-10-CM | POA: Diagnosis not present

## 2023-11-23 MED ORDER — ZOLPIDEM TARTRATE 5 MG PO TABS
2.5000 mg | ORAL_TABLET | Freq: Every evening | ORAL | 0 refills | Status: AC | PRN
Start: 2023-11-23 — End: 2023-12-23

## 2023-11-23 MED ORDER — IOPAMIDOL (ISOVUE-M 200) INJECTION 41%
10.0000 mL | Freq: Once | INTRAMUSCULAR | Status: AC
  Administered 2023-11-23: 10 mL via INTRA_ARTICULAR

## 2023-11-23 MED ORDER — VENLAFAXINE HCL ER 37.5 MG PO CP24: 37.5000 mg | ORAL_CAPSULE | Freq: Every day | ORAL | 1 refills | Status: DC

## 2023-11-23 NOTE — Progress Notes (Signed)
 Virtual Visit via Video Note  I connected with Jose Maddox on 11/23/23 at  9:00 AM EDT by a video enabled telemedicine application and verified that I am speaking with the correct person using two identifiers.  Location Provider Location : [facility] Patient Location : Home  Participants: Patient , Provider   I discussed the limitations of evaluation and management by telemedicine and the availability of in person appointments. The patient expressed understanding and agreed to proceed.   I discussed the assessment and treatment plan with the patient. The patient was provided an opportunity to ask questions and all were answered. The patient agreed with the plan and demonstrated an understanding of the instructions.   The patient was advised to call back or seek an in-person evaluation if the symptoms worsen or if the condition fails to improve as anticipated.   BH MD OP Progress Note  11/23/2023 2:19 PM Jose Maddox  MRN:  [id number]  Chief Complaint:  Chief Complaint  Patient presents with   Follow-up   Medication Refill   Anxiety   Discussed the use of AI scribe software for clinical note transcription with the patient, who gave verbal consent to proceed.  History of Present Illness Jose Maddox is a 53 year old Caucasian male, self-employed, married, lives in [place], has a history of GAD, insomnia, gastroesophageal reflux disease, hyperlipidemia, erectile dysfunction, nonscarring hair loss was evaluated by telemedicine today.  Patient's last visit was on 04/14/2022.Patient is here to reestablish care.  Increasing stress and anxiety, particularly related to work demands and maintaining a positive work environment, affect him. He describes feeling overwhelmed, easily irritable, and having difficulty multitasking, which he believes negatively impacts his interactions with employees and overall work performance. Anxiety symptoms include feeling nervous or on edge every other day,  daily excessive worry, trouble relaxing (especially on workdays), periodic restlessness, and frequent irritability. He rates the impact of these symptoms on his daily functioning as very difficult.  Longstanding difficulties with attention, organization, and task completion, which he associates with possible adult ADHD, persist. He reports frequent trouble wrapping up projects, organizing tasks, remembering appointments, delaying task initiation, restlessness, distractibility, and difficulty concentrating, all of which have been present since adolescence. He has tried Adderall,prescribed by his primary care physician, but found it too stimulating. He expresses interest in non-stimulant options for managing these symptoms.  He endorses mild depressive symptoms, including feeling down several days, mild lack of interest, mild to moderate fatigue, daily trouble concentrating, and frequent feelings of worthlessness. He denies changes in appetite or weight.  This has been ongoing since the past several weeks.  Sleep difficulties, particularly on work nights, continue, with only 4 good nights of sleep in the past week. He notes that stress related to work often interferes with his ability to fall asleep and that he previously used Ambien  5 mg as needed, which he found helpful but is currently out of. He aims for 7 to 8 hours of sleep per night and feels rested when he achieves this.  He continues to see his therapist monthly for support. Coping strategies include talking with his wife and therapist. Physical activity, particularly weightlifting, previously helped his mood and stress, but a recent shoulder injury has limited his ability to exercise, which he feels has negatively affected his well-being. Ongoing pain and sleep disruption related to the injury persist, which he manages with Advil, Tylenol, and intermittent prednisone as needed.  He is scheduled to get an MRI today.  He denies suicidal ideation or  thoughts of self-harm during the current assessment.  He denies any homicidality.  Did not express any perceptual disturbances.  He reports past problematic alcohol  use. 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 He describes a recent episode at the beach where he had 2 glasses of wine and noted increased desire to continue drinking, which reinforced his decision to avoid regular alcohol  use. He reports that he still drinks non-alcoholic beer.    Visit Diagnosis:    ICD-10-CM   1. GAD (generalized anxiety disorder)  F41.1 venlafaxine  XR (EFFEXOR -XR) 37.5 MG 24 hr capsule    2. Insomnia due to medical condition  G47.01 zolpidem  (AMBIEN ) 5 MG tablet   Anxiety, pain    3. Depression, unspecified depression type  F32.A     4. Attention and concentration deficit  R41.840 Ambulatory referral to Neuropsychology      Past Psychiatric History: I have reviewed past psychiatric history from progress note on 06/10/2020.  Past trials of medications like Cymbalta , sertraline   Past Medical History:  Past Medical History:  Diagnosis Date   Anxiety    GERD (gastroesophageal reflux disease)    Hyperlipidemia    IBS (irritable bowel syndrome)     Past Surgical History:  Procedure Laterality Date   FORAMINOTOMY 1 LEVEL     C7   WRIST SURGERY Left 2004    Family Psychiatric History: I have reviewed family psychiatric history from progress note on 06/10/2020.  Family History:  Family History  Problem Relation Age of Onset   Uterine cancer Other        Grandmother   Arthritis Father    Hyperlipidemia Father    Alcohol  abuse Father    Alzheimer's disease Paternal Grandmother    Alcoholism Paternal Grandfather    Cirrhosis Paternal Grandfather    Alcohol  abuse Paternal Grandfather    Drug abuse Paternal Uncle    Drug abuse Paternal Uncle     Social History: I have reviewed social history from progress note on  06/10/2020. Social History   Socioeconomic History   Marital status: Married    Spouse name: Not on file   Number of children: 2   Years of education: Not on file   Highest education level: Not on file  Occupational History   Not on file  Tobacco Use   Smoking status: Former   Smokeless tobacco: Former    Types: Snuff    Quit date: 05/13/2017  Substance and Sexual Activity   Alcohol  use: Yes    Alcohol /week: 6.0 standard drinks of alcohol     Types: 6 Cans of beer per week   Drug use: Not on file    Comment: twice a year   Sexual activity: Yes  Other Topics Concern   Not on file  Social History Narrative   Not on file   Social Drivers of Health   Financial Resource Strain: Not on file  Food Insecurity: Not on file  Transportation Needs: Not on file  Physical Activity: Not on file  Stress: Not on file  Social Connections: Unknown (09/15/2021)   [facility]   Social Network    Social Network: Not on file    Allergies: No Known Allergies  Metabolic Disorder Labs: No results found for: HGBA1C, MPG No results found for: PROLACTIN No results found for: CHOL, TRIG, HDL, CHOLHDL, VLDL, LDLCALC No results found for: TSH  Therapeutic Level Labs: No results found  for: LITHIUM No results found for: VALPROATE No results found for: CBMZ  Current Medications: Current Outpatient Medications  Medication Sig Dispense Refill   predniSONE (STERAPRED UNI-PAK 21 TAB) 10 MG (21) TBPK tablet Take by mouth.     venlafaxine  XR (EFFEXOR -XR) 37.5 MG 24 hr capsule Take 1 capsule (37.5 mg total) by mouth daily with breakfast. 30 capsule 1   zolpidem  (AMBIEN ) 5 MG tablet Take 0.5-1 tablets (2.5-5 mg total) by mouth at bedtime as needed for sleep. 30 tablet 0   finasteride (PROPECIA) 1 MG tablet Take 1 mg by mouth daily. Takes differently     ibuprofen (ADVIL,MOTRIN) 200 MG tablet Take 200 mg by mouth every 6 (six) hours as needed.     Misc. Devices  MISC Compounding pharmacy cream - gaba 5%, keta 5%, keto 5%, lido 5%, amit 2%, carb cream  Apply daily prn pain. Dispense 4 ounces. 5 refills.     propranolol (INDERAL) 10 MG tablet      tadalafil (CIALIS) 10 MG tablet Take 10 mg by mouth as needed.     zolpidem  (AMBIEN ) 5 MG tablet Take 1 tablet (5 mg total) by mouth at bedtime as needed for sleep. (Patient not taking: Reported on 04/14/2022) 30 tablet 2   No current facility-administered medications for this visit.     Musculoskeletal: Strength & Muscle Tone: within normal limits Gait & Station: normal Patient leans: N/A  Psychiatric Specialty Exam: Review of Systems  Psychiatric/Behavioral:  Positive for decreased concentration, dysphoric mood and sleep disturbance. The patient is nervous/anxious.     There were no vitals taken for this visit.There is no height or weight on file to calculate BMI.  General Appearance: Fairly Groomed  Eye Contact:  Fair  Speech:  Clear and Coherent  Volume:  Normal  Mood:  Anxious and Depressed  Affect:  Congruent  Thought Process:  Goal Directed and Descriptions of Associations: Intact  Orientation:  Full (Time, Place, and Person)  Thought Content: Logical   Suicidal Thoughts:  No  Homicidal Thoughts:  No  Memory:  Immediate;   Fair Recent;   Fair Remote;   Fair  Judgement:  Fair  Insight:  Fair  Psychomotor Activity:  Normal  Concentration:  Concentration: Fair and Attention Span: Fair  Recall:  [employer] of Knowledge: Fair  Language: Fair  Akathisia:  No  Handed:  Right  AIMS (if indicated): not done  Assets:  Communication Skills Desire for Improvement Housing Social Support Talents/Skills Transportation  ADL's:  Intact  Cognition: WNL  Sleep:  varies   Screenings: AUDIT    Flowsheet Row Video Visit from 03/25/2021 in [facility]  Alcohol  Use Disorder Identification Test Final Score (AUDIT) 5   GAD-7    Flowsheet Row Video  Visit from 11/23/2023 in [facility] Video Visit from 04/14/2022 in [facility] Video Visit from 12/02/2021 in [facility] Office Visit from 09/30/2021 in [facility] Video Visit from 07/29/2021 in [facility]  Total GAD-7 Score 16 0 1 0 2   PHQ2-9    Flowsheet Row Video Visit from 11/23/2023 in [facility] Video Visit from 04/14/2022 in [facility] Video Visit from 12/02/2021 in [facility] Office Visit from 09/30/2021 in [facility] Video Visit from 07/29/2021 in [facility]  PHQ-2 Total Score 2 0 0 0 1  PHQ-9 Total Score 10 -- -- 4 --   Flowsheet Row Video Visit from 11/23/2023 in [facility] Video Visit from 04/14/2022 in [facility] Video Visit from 12/02/2021 in [facility]  C-SSRS RISK CATEGORY No Risk No Risk No Risk     Assessment and Plan:Bharat Rosengren is a 53 year old Caucasian male returns to reestablish care with worsening mood symptoms, comorbid pain as well as concerns about attention and focus problems.  Discussed assessment and plan as noted below.  Assessment & Plan Generalized anxiety disorder -unstable Chronic anxiety with stress, irritability, and difficulty multitasking, exacerbated by work stress and lack of sleep. Anxiety may contribute to attention and focus problems. Previous SSRIs were effective but caused vasovagal syncope and other side effects. Considering non-stimulant options for ADHD symptoms. Venlafaxine  is considered due to its dual action on anxiety and  ADHD symptoms. - Prescribe Venlafaxine  37.5 mg extended release daily in the morning - Monitor blood pressure regularly due to potential side effect of elevated blood pressure - Discuss potential side effects of venlafaxine  including GI issues, dizziness, blood pressure reading, drowsiness, headaches, and withdrawal symptoms - Advise gradual tapering if discontinuing Venlafaxine  - Screen for ADHD symptoms and consider formal ADHD testing if needed - Refer to neuropsychologist within [facility] for formal ADHD testing if indicated  Depressive disorder, unspecified-unstable Reports mild symptoms of feeling down and lack of interest over the past two weeks.  - Start Venlafaxine  extended release 37.5 mg daily - Continue psychotherapy sessions.  Suspected adult attention deficit hyperactivity disorder-unstable Symptoms of disorganization, difficulty completing tasks, and restlessness. Previous trial of Adderall was too stimulating. Considering non-stimulant medication options. ADHD symptoms may overlap with anxiety symptoms. Venlafaxine  is considered as a non-stimulant option for ADHD symptoms. - Screen for ADHD symptoms - Ambulatory referral for neuropsychological testing to rule out ADHD - Start Venlafaxine  as a non-stimulant option for ADHD symptoms  Insomnia-unstable Difficulty sleeping due to stress and anxiety, particularly on workdays. Previously used Ambien  effectively for situational insomnia. - Prescribe Ambien  5 mg as needed - Will need sufficient pain management. - Reviewed [place] PMP AWARxE  Follow-Up - Schedule follow-up appointment for December 28, 2023, at 9:30 AM via video visit - Advise to send recent lab results for review - Instruct to monitor blood pressure regularly while on venlafaxine  - Advise to ensure pain and sleep are managed before ADHD testing - Instruct to contact if no call from neuropsychologist's office within two weeks   Collaboration of Care: Collaboration  of Care: Other will review most recent labs, patient agrees to fax it over.  Patient/Guardian was advised Release of Information must be obtained prior to any record release in order to collaborate their care with an outside provider. Patient/Guardian was advised if they have not already done so to contact the registration department to sign all necessary forms in order for us  to release information regarding their care.   Consent: Patient/Guardian gives verbal consent for treatment and assignment of benefits for services provided during this visit. Patient/Guardian expressed understanding and agreed to proceed.   I have spent atleast 40 minutes face to face with patient today by video which includes the time spent for preparing to see the patient ( e.g., review of test, records ), obtaining and to review and separately obtained history , ordering medications and test ,psychoeducation and supportive psychotherapy  and care coordination,as well as documenting clinical information in electronic health record.  Jose Sherk, MD 11/23/2023, 2:19 PM

## 2023-11-23 NOTE — Patient Instructions (Signed)
Venlafaxine Extended-Release Capsules What is this medication? VENLAFAXINE (VEN la fax een) treats depression and anxiety. It increases the amount of serotonin and norepinephrine in the brain, hormones that help regulate mood. It belongs to a group of medications called SNRIs. This medicine may be used for other purposes; ask your health care provider or pharmacist if you have questions. COMMON BRAND NAME(S): Effexor XR What should I tell my care team before I take this medication? They need to know if you have any of these conditions: Bleeding disorders Glaucoma Heart disease High blood pressure High cholesterol Kidney disease Liver disease Low levels of sodium in the blood Mania or bipolar disorder Seizures Suicidal thoughts, plans, or attempt by you or a family member Take medications that treat or prevent blood clots Thyroid disease An unusual or allergic reaction to venlafaxine, other medications, foods, dyes, or preservatives Pregnant or trying to get pregnant Breastfeeding How should I use this medication? Take this medication by mouth with a full glass of water. Take it as directed on the prescription label. Do not cut, crush, or chew this medication. Take it with food. You may open the capsule and put the contents in 1 teaspoon of applesauce. Swallow the medication and applesauce right away. Do not chew the medication or applesauce. Follow with a glass of water to ensure complete swallowing of the pellets. Try to take your medication at about the same time each day. Do not take your medication more often than directed. Keep taking this medication unless your care team tells you to stop. Stopping it too quickly can cause serious side effects. It can also make your condition worse. A special MedGuide will be given to you by the pharmacist with each prescription and refill. Be sure to read this information carefully each time. Talk to your care team about the use of this medication in  children. Special care may be needed. Overdosage: If you think you have taken too much of this medicine contact a poison control center or emergency room at once. NOTE: This medicine is only for you. Do not share this medicine with others. What if I miss a dose? If you miss a dose, take it as soon as you can. If it is almost time for your next dose, take only that dose. Do not take double or extra doses. What may interact with this medication? Do not take this medication with any of the following: Alcohol Certain medications for fungal infections, such as fluconazole, itraconazole, ketoconazole, posaconazole, voriconazole Cisapride Desvenlafaxine Dronedarone Duloxetine Levomilnacipran Linezolid MAOIs, such as Carbex, Eldepryl, Marplan, Nardil, and Parnate Methylene blue (injected into a vein) Milnacipran Pimozide Thioridazine This medication may also interact with the following: Amphetamines Aspirin and aspirin-like medications Certain medications for mental health conditions Certain medications for migraine headaches, such as almotriptan, eletriptan, frovatriptan, naratriptan, rizatriptan, sumatriptan, zolmitriptan Certain medications for sleep Certain medications that treat or prevent blood clots, such as dalteparin, enoxaparin, warfarin Cimetidine Clozapine Diuretics Fentanyl Furazolidone Indinavir Isoniazid Lithium Metoprolol NSAIDS, medications for pain and inflammation, such as ibuprofen or naproxen Other medications that cause heart rhythm changes Procarbazine Rasagiline Supplements, such as St. John's wort, kava kava, valerian Tramadol Tryptophan This list may not describe all possible interactions. Give your health care provider a list of all the medicines, herbs, non-prescription drugs, or dietary supplements you use. Also tell them if you smoke, drink alcohol, or use illegal drugs. Some items may interact with your medicine. What should I watch for while using  this medication? Tell   your care team if your symptoms do not get better or if they get worse. Visit your care team for regular checks on your progress. Because it may take several weeks to see the full effects of this medication, it is important to continue your treatment as prescribed by your care team. Watch for new or worsening thoughts of suicide or depression. This includes sudden changes in mood, behaviors, or thoughts. These changes can happen at any time but are more common in the beginning of treatment or after a change in dose. Call your care team right away if you experience these thoughts or worsening depression. This medication may cause mood and behavior changes, such as anxiety, nervousness, irritability, hostility, restlessness, excitability, hyperactivity, or trouble sleeping. These changes can happen at any time but are more common in the beginning of treatment or after a change in dose. Call your care team right away if you notice any of these symptoms. This medication can cause an increase in blood pressure. Check with your care team for instructions on monitoring your blood pressure while taking this medication. This medication may affect your coordination, reaction time, or judgment. Do not drive or operate machinery until you know how this medication affects you. Sit up or stand slowly to reduce the risk of dizzy or fainting spells. Drinking alcohol with this medication can increase the risk of these side effects. Your mouth may get dry. Chewing sugarless gum or sucking hard candy and drinking plenty of water may help. Contact your care team if the problem does not go away or is severe. What side effects may I notice from receiving this medication? Side effects that you should report to your care team as soon as possible: Allergic reactions--skin rash, itching, hives, swelling of the face, lips, tongue, or throat Bleeding--bloody or black, tar-like stools, red or dark brown urine,  vomiting blood or brown material that looks like coffee grounds, small, red or purple spots on skin, unusual bleeding or bruising Heart rhythm changes--fast or irregular heartbeat, dizziness, feeling faint or lightheaded, chest pain, trouble breathing Increase in blood pressure Loss of appetite with weight loss Low sodium level--muscle weakness, fatigue, dizziness, headache, confusion Serotonin syndrome--irritability, confusion, fast or irregular heartbeat, muscle stiffness, twitching muscles, sweating, high fever, seizures, chills, vomiting, diarrhea Sudden eye pain or change in vision such as blurry vision, seeing halos around lights, vision loss Thoughts of suicide or self-harm, worsening mood, feelings of depression Side effects that usually do not require medical attention (report to your care team if they continue or are bothersome): Anxiety, nervousness Change in sex drive or performance Dizziness Dry mouth Excessive sweating Nausea Tremors or shaking Trouble sleeping This list may not describe all possible side effects. Call your doctor for medical advice about side effects. You may report side effects to FDA at 1-800-FDA-1088. Where should I keep my medication? Keep out of the reach of children and pets. Store at a controlled temperature between 20 and 25 degrees C (68 degrees and 77 degrees F), in a dry place. Throw away any unused medication after the expiration date. NOTE: This sheet is a summary. It may not cover all possible information. If you have questions about this medicine, talk to your doctor, pharmacist, or health care provider.  2024 Elsevier/Gold Standard (2022-03-02 00:00:00)  

## 2023-12-14 ENCOUNTER — Encounter: Payer: Self-pay | Admitting: Psychiatry

## 2023-12-15 ENCOUNTER — Other Ambulatory Visit: Payer: Self-pay | Admitting: Psychiatry

## 2023-12-15 DIAGNOSIS — F411 Generalized anxiety disorder: Secondary | ICD-10-CM

## 2024-01-04 ENCOUNTER — Encounter: Payer: Self-pay | Admitting: Psychiatry

## 2024-01-04 ENCOUNTER — Telehealth: Admitting: Psychiatry

## 2024-01-04 DIAGNOSIS — F411 Generalized anxiety disorder: Secondary | ICD-10-CM | POA: Diagnosis not present

## 2024-01-04 DIAGNOSIS — R52 Pain, unspecified: Secondary | ICD-10-CM | POA: Diagnosis not present

## 2024-01-04 DIAGNOSIS — F32A Depression, unspecified: Secondary | ICD-10-CM | POA: Diagnosis not present

## 2024-01-04 DIAGNOSIS — G4701 Insomnia due to medical condition: Secondary | ICD-10-CM | POA: Diagnosis not present

## 2024-01-04 DIAGNOSIS — R41840 Attention and concentration deficit: Secondary | ICD-10-CM

## 2024-01-04 NOTE — Progress Notes (Signed)
 Virtual Visit via Video Note  I connected with Jose Maddox on 01/04/24 at  9:30 AM EDT by a video enabled telemedicine application and verified that I am speaking with the correct person using two identifiers.  Location Provider Location : [facility] Patient Location : Car  Participants: Patient , Provider    I discussed the limitations of evaluation and management by telemedicine and the availability of in person appointments. The patient expressed understanding and agreed to proceed.   I discussed the assessment and treatment plan with the patient. The patient was provided an opportunity to ask questions and all were answered. The patient agreed with the plan and demonstrated an understanding of the instructions.   The patient was advised to call back or seek an in-person evaluation if the symptoms worsen or if the condition fails to improve as anticipated.   BH MD OP Progress Note  01/04/2024 10:00 AM Jose Maddox  MRN:  [id number]  Chief Complaint:  Chief Complaint  Patient presents with   Follow-up   Medication Refill   Depression   Anxiety   Discussed the use of AI scribe software for clinical note transcription with the patient, who gave verbal consent to proceed.  History of Present Illness Jose Maddox is a 53 year old Caucasian male, self-employed, married, lives in [place], has a history of GAD, insomnia, gastroesophageal reflux disease, hyperlipidemia, erectile dysfunction, nonscarring hair loss was evaluated by telemedicine today.  Since starting venlafaxine  37.5 mg daily approximately 6 to 7 weeks ago, he reports improved anxiety and feels more relaxed. During the first few days, he experienced mild sedation and a sensation similar to being on cough medicine, which has since resolved. He identifies a decrease in libido as the primary ongoing side effect, consistent with his experiences on other medications. He currently takes venlafaxine  37.5 mg daily and uses  Ambien  as needed for sleep, typically taking half a pill 1 or 2 times per week when feeling restless at bedtime.  He describes his sleep as generally adequate, averaging 7 to 8 hours per night, though he does not always feel fully rested in the morning and sometimes feels tired during the day. He denies any thoughts of harming himself or others and denies suicidality. He is awaiting ADHD testing, with an appointment scheduled for February.  He has testosterone  deficiency. He plans to undergo supraspinatus repair surgery.  He continues to follow-up with his providers for the same.  Visit Diagnosis:    ICD-10-CM   1. GAD (generalized anxiety disorder)  F41.1     2. Insomnia due to medical condition  G47.01    Anxiety, pain    3. Depression, unspecified depression type  F32.A     4. Attention and concentration deficit  R41.840       Past Psychiatric History: I have reviewed past psychiatric history from progress note on 06/10/2020.  Past trials of medications like Cymbalta , sertraline .  Past Medical History:  Past Medical History:  Diagnosis Date   Anxiety    GERD (gastroesophageal reflux disease)    Hyperlipidemia    IBS (irritable bowel syndrome)     Past Surgical History:  Procedure Laterality Date   FORAMINOTOMY 1 LEVEL     C7   WRIST SURGERY Left 2004    Family Psychiatric History: I have reviewed family psychiatric history from progress note on 06/10/2020.  Family History:  Family History  Problem Relation Age of Onset   Uterine cancer Other        Grandmother  Arthritis Father    Hyperlipidemia Father    Alcohol  abuse Father    Alzheimer's disease Paternal Grandmother    Alcoholism Paternal Grandfather    Cirrhosis Paternal Grandfather    Alcohol  abuse Paternal Grandfather    Drug abuse Paternal Uncle    Drug abuse Paternal Uncle     Social History: I have reviewed social history from progress note on 06/10/2020. Social History   Socioeconomic History    Marital status: Married    Spouse name: Not on file   Number of children: 2   Years of education: Not on file   Highest education level: Not on file  Occupational History   Not on file  Tobacco Use   Smoking status: Former   Smokeless tobacco: Former    Types: Snuff    Quit date: 05/13/2017  Substance and Sexual Activity   Alcohol  use: Yes    Alcohol /week: 6.0 standard drinks of alcohol     Types: 6 Cans of beer per week   Drug use: Not on file    Comment: twice a year   Sexual activity: Yes  Other Topics Concern   Not on file  Social History Narrative   Not on file   Social Drivers of Health   Financial Resource Strain: Not on file  Food Insecurity: Not on file  Transportation Needs: Not on file  Physical Activity: Not on file  Stress: Not on file  Social Connections: Unknown (09/15/2021)   Received from [facility]   Social Network    Social Network: Not on file    Allergies: No Known Allergies  Metabolic Disorder Labs: No results found for: HGBA1C, MPG No results found for: PROLACTIN No results found for: CHOL, TRIG, HDL, CHOLHDL, VLDL, LDLCALC No results found for: TSH  Therapeutic Level Labs: No results found for: LITHIUM No results found for: VALPROATE No results found for: CBMZ  Current Medications: Current Outpatient Medications  Medication Sig Dispense Refill   finasteride (PROPECIA) 1 MG tablet Take 1 mg by mouth daily. Takes differently     ibuprofen (ADVIL,MOTRIN) 200 MG tablet Take 200 mg by mouth every 6 (six) hours as needed.     Misc. Devices MISC Compounding pharmacy cream - gaba 5%, keta 5%, keto 5%, lido 5%, amit 2%, carb cream  Apply daily prn pain. Dispense 4 ounces. 5 refills.     predniSONE (STERAPRED UNI-PAK 21 TAB) 10 MG (21) TBPK tablet Take by mouth.     propranolol (INDERAL) 10 MG tablet      tadalafil (CIALIS) 10 MG tablet Take 10 mg by mouth as needed.     venlafaxine  XR (EFFEXOR -XR) 37.5 MG 24 hr  capsule TAKE 1 CAPSULE BY MOUTH DAILY WITH BREAKFAST. 90 capsule 0   zolpidem  (AMBIEN ) 5 MG tablet Take 0.5-1 tablets (2.5-5 mg total) by mouth at bedtime as needed for sleep. 30 tablet 0   No current facility-administered medications for this visit.     Musculoskeletal: Strength & Muscle Tone: UTA Gait & Station: Seated Patient leans: N/A  Psychiatric Specialty Exam: Review of Systems  Psychiatric/Behavioral:  Positive for decreased concentration, dysphoric mood and sleep disturbance. The patient is nervous/anxious.     There were no vitals taken for this visit.There is no height or weight on file to calculate BMI.  General Appearance: Casual  Eye Contact:  Fair  Speech:  Clear and Coherent  Volume:  Normal  Mood:  Anxious and Depressed  Affect:  Appropriate  Thought Process:  Goal  Directed and Descriptions of Associations: Intact  Orientation:  Full (Time, Place, and Person)  Thought Content: Logical   Suicidal Thoughts:  No  Homicidal Thoughts:  No  Memory:  Immediate;   Fair Recent;   Fair Remote;   Fair  Judgement:  Fair  Insight:  Fair  Psychomotor Activity:  Normal  Concentration:  Concentration: Fair and Attention Span: Fair  Recall:  [employer] of Knowledge: Fair  Language: Fair  Akathisia:  No  Handed:  Right  AIMS (if indicated): not done  Assets:  Communication Skills Desire for Improvement Housing Social Support  ADL's:  Intact  Cognition: WNL  Sleep:  improving   Screenings: AUDIT    Flowsheet Row Video Visit from 03/25/2021 in [facility]  Alcohol  Use Disorder Identification Test Final Score (AUDIT) 5   GAD-7    Flowsheet Row Video Visit from 11/23/2023 in [facility] Video Visit from 04/14/2022 in [facility] Video Visit from 12/02/2021 in [facility] Office Visit from 09/30/2021 in [facility] Video Visit from 07/29/2021 in [facility]  Total GAD-7 Score 16 0 1 0 2   PHQ2-9    Flowsheet Row Video Visit from 11/23/2023 in [facility] Video Visit from 04/14/2022 in [facility] Video Visit from 12/02/2021 in [facility] Office Visit from 09/30/2021 in [facility] Video Visit from 07/29/2021 in [facility]  PHQ-2 Total Score 2 0 0 0 1  PHQ-9 Total Score 10 -- -- 4 --   Flowsheet Row Video Visit from 01/04/2024 in [facility] Video Visit from 11/23/2023 in [facility] Video Visit from 04/14/2022 in [facility]  C-SSRS RISK CATEGORY No Risk No Risk No Risk     Assessment and Plan: Jose Maddox is a 53 year old Caucasian male, presented for follow-up appointment.  Discussed assessment and plan as noted below.  1. GAD (generalized anxiety disorder)-some improvement Currently on venlafaxine  with some improvement in mood symptoms, however struggles with sexual side effects likely from venlafaxine .  Does have a history of testosterone  deficiency and is currently undergoing treatment. Continue Venlafaxine  extended release 37.5 mg daily for now. Would like to wait for a few weeks to see if sexual side effects gets better before making any other changes.  2. Insomnia due to medical condition-improving Currently sleep is improved and does use the Ambien  as needed.  Does have pain and is currently under the care of a provider for the same, may need surgery in the future. Continue sufficient pain management. Continue Ambien  5 mg at bedtime as needed Reviewed [place] PMP AWARxE  3. Depression,  unspecified depression type-some improvement Notes some improvement on the venlafaxine  and would like to stay on the current dosage for now. Continue Venlafaxine  extended release 37.5 mg daily  4. Attention and concentration deficit-unstable Ongoing attention and focus problems, referred for neuropsychological testing to rule out ADHD. Has upcoming appointment for testing in February.  Encouraged to reach out to the clinic regarding getting a sooner appointment.   Follow-up Follow-up in clinic in 3 months or sooner if needed.  In person for next visit.   Consent: Patient/Guardian gives verbal consent for treatment and assignment of benefits for services  provided during this visit. Patient/Guardian expressed understanding and agreed to proceed.   This note was generated in part or whole with voice recognition software. Voice recognition is usually quite accurate but there are transcription errors that can and very often do occur. I apologize for any typographical errors that were not detected and corrected.    Jose Yonker, MD 01/04/2024, 10:00 AM

## 2024-03-21 ENCOUNTER — Other Ambulatory Visit: Payer: Self-pay | Admitting: Psychiatry

## 2024-03-21 DIAGNOSIS — F411 Generalized anxiety disorder: Secondary | ICD-10-CM

## 2024-05-08 ENCOUNTER — Ambulatory Visit: Admitting: Psychiatry

## 2024-05-08 ENCOUNTER — Encounter: Admitting: Psychology

## 2024-06-05 IMAGING — XA DG INJECT/[PERSON_NAME] INC NEEDLE/CATH/PLC EPI/CERV/THOR W/IMG
2 series · 2 of 2 positions shown · non-contrast
Comparison: none

CLINICAL DATA: Spondylosis without myelopathy. Right foraminal
stenosis C5-6. Bilateral foraminal stenosis C6-7. Right arm symptoms
presently.

[Series 1: ortho standard · 1 of 1 slices shown (1 of 2)]
[im 1/1]
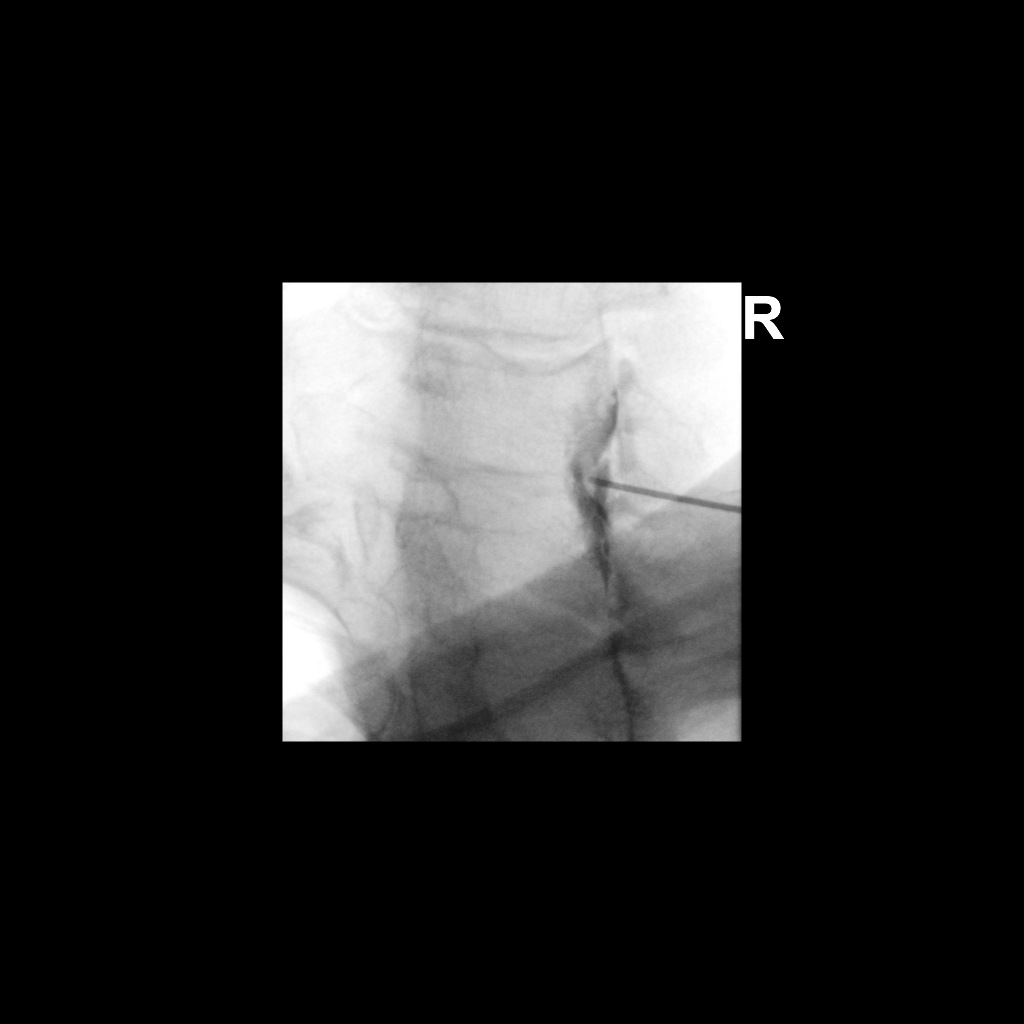

[Series 2: ortho standard · 1 of 1 slices shown (2 of 2)]
[im 1/1]
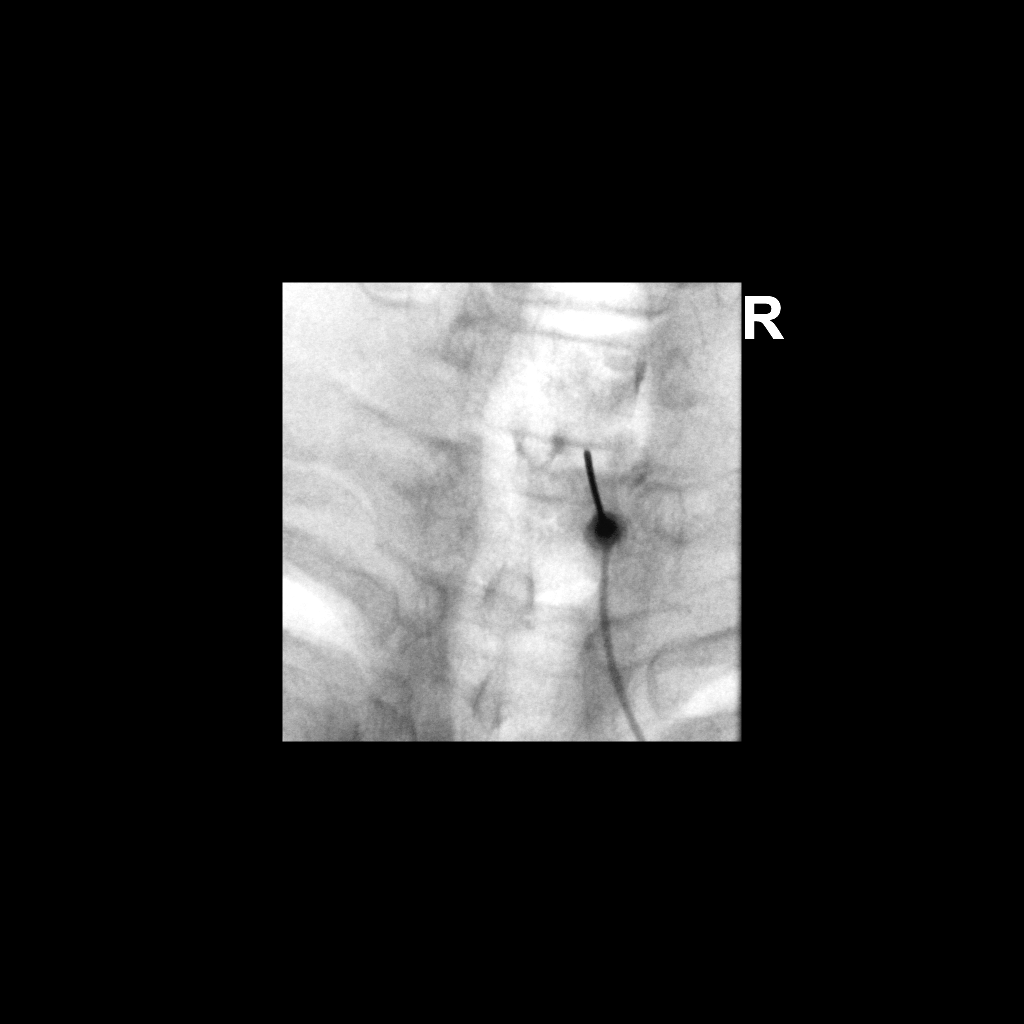

[2 of 2 positions shown; findings below may reference images not displayed]

FLUOROSCOPY:
Radiation Exposure Index (as provided by the fluoroscopic device): 0
minutes 21 seconds. 6.81 micro gray meter squared

PROCEDURE:
CERVICAL EPIDURAL INJECTION

An interlaminar approach was performed on the the right at C7-T1. A
20 gauge epidural needle was advanced using loss-of-resistance
technique.

DIAGNOSTIC EPIDURAL INJECTION

Injection of Isovue-M 300 shows a good epidural pattern with spread
above and below the level of needle placement, primarily on the
right. No vascular opacification is seen. THERAPEUTIC

EPIDURAL INJECTION

1.5 ml of Kenalog 40 mixed with 1 ml of 1% Lidocaine and 2 ml of
normal saline were then instilled. The procedure was well-tolerated,
and the patient was discharged thirty minutes following the
injection in good condition.
IMPRESSION: Technically successful initial epidural injection on the right at
C7-T1.
